# Patient Record
Sex: Male | Born: 1950
Health system: Southern US, Community
[De-identification: ages and names within clinical notes are randomized; demographics above are authoritative.]

## PROBLEM LIST (undated history)

## (undated) DIAGNOSIS — K219 Gastro-esophageal reflux disease without esophagitis: Secondary | ICD-10-CM

## (undated) DIAGNOSIS — N529 Male erectile dysfunction, unspecified: Secondary | ICD-10-CM

## (undated) DIAGNOSIS — Z972 Presence of dental prosthetic device (complete) (partial): Secondary | ICD-10-CM

## (undated) DIAGNOSIS — E785 Hyperlipidemia, unspecified: Secondary | ICD-10-CM

## (undated) DIAGNOSIS — D649 Anemia, unspecified: Secondary | ICD-10-CM

## (undated) DIAGNOSIS — Z5189 Encounter for other specified aftercare: Secondary | ICD-10-CM

## (undated) DIAGNOSIS — K5790 Diverticulosis of intestine, part unspecified, without perforation or abscess without bleeding: Secondary | ICD-10-CM

## (undated) DIAGNOSIS — E162 Hypoglycemia, unspecified: Secondary | ICD-10-CM

## (undated) DIAGNOSIS — Z973 Presence of spectacles and contact lenses: Secondary | ICD-10-CM

## (undated) DIAGNOSIS — K635 Polyp of colon: Secondary | ICD-10-CM

## (undated) HISTORY — PX: POLYPECTOMY: SHX149

## (undated) HISTORY — PX: COLONOSCOPY: SHX174

## (undated) HISTORY — PX: OTHER SURGICAL HISTORY: SHX169

## (undated) HISTORY — DX: Encounter for other specified aftercare: Z51.89

## (undated) HISTORY — PX: CHOLECYSTECTOMY: SHX55

## (undated) HISTORY — DX: Hyperlipidemia, unspecified: E78.5

## (undated) HISTORY — PX: APPENDECTOMY: SHX54

## (undated) HISTORY — DX: Gastro-esophageal reflux disease without esophagitis: K21.9

## (undated) HISTORY — DX: Polyp of colon: K63.5

## (undated) HISTORY — DX: Diverticulosis of intestine, part unspecified, without perforation or abscess without bleeding: K57.90

---

## 2003-09-23 ENCOUNTER — Emergency Department (HOSPITAL_COMMUNITY): Admission: EM | Admit: 2003-09-23 | Discharge: 2003-09-23 | Payer: Self-pay | Admitting: Emergency Medicine

## 2004-06-19 ENCOUNTER — Ambulatory Visit: Payer: Self-pay | Admitting: Family Medicine

## 2004-06-24 ENCOUNTER — Ambulatory Visit: Payer: Self-pay | Admitting: Family Medicine

## 2005-01-06 ENCOUNTER — Ambulatory Visit: Payer: Self-pay | Admitting: Family Medicine

## 2005-02-19 ENCOUNTER — Ambulatory Visit: Payer: Self-pay | Admitting: Family Medicine

## 2005-02-21 ENCOUNTER — Ambulatory Visit: Payer: Self-pay | Admitting: Family Medicine

## 2005-05-07 ENCOUNTER — Ambulatory Visit: Payer: Self-pay | Admitting: Family Medicine

## 2008-10-13 ENCOUNTER — Ambulatory Visit: Payer: Self-pay | Admitting: Gastroenterology

## 2008-10-27 ENCOUNTER — Ambulatory Visit: Payer: Self-pay | Admitting: Gastroenterology

## 2008-11-10 ENCOUNTER — Ambulatory Visit: Payer: Self-pay | Admitting: Gastroenterology

## 2008-11-24 ENCOUNTER — Ambulatory Visit: Payer: Self-pay | Admitting: Gastroenterology

## 2008-11-24 ENCOUNTER — Encounter: Payer: Self-pay | Admitting: Gastroenterology

## 2008-11-24 DIAGNOSIS — K219 Gastro-esophageal reflux disease without esophagitis: Secondary | ICD-10-CM

## 2008-11-24 LAB — CONVERTED CEMR LAB: UREASE: NEGATIVE

## 2008-11-28 ENCOUNTER — Encounter: Payer: Self-pay | Admitting: Gastroenterology

## 2011-02-11 DIAGNOSIS — H3020 Posterior cyclitis, unspecified eye: Secondary | ICD-10-CM | POA: Insufficient documentation

## 2011-08-12 DIAGNOSIS — B0051 Herpesviral iridocyclitis: Secondary | ICD-10-CM | POA: Insufficient documentation

## 2014-07-11 DIAGNOSIS — B0051 Herpesviral iridocyclitis: Secondary | ICD-10-CM | POA: Diagnosis not present

## 2014-07-11 DIAGNOSIS — H2011 Chronic iridocyclitis, right eye: Secondary | ICD-10-CM | POA: Diagnosis not present

## 2014-07-11 DIAGNOSIS — B0052 Herpesviral keratitis: Secondary | ICD-10-CM | POA: Insufficient documentation

## 2014-07-19 DIAGNOSIS — B0052 Herpesviral keratitis: Secondary | ICD-10-CM | POA: Diagnosis not present

## 2014-07-26 DIAGNOSIS — B0051 Herpesviral iridocyclitis: Secondary | ICD-10-CM | POA: Diagnosis not present

## 2014-08-07 DIAGNOSIS — B0051 Herpesviral iridocyclitis: Secondary | ICD-10-CM | POA: Diagnosis not present

## 2014-09-04 DIAGNOSIS — M549 Dorsalgia, unspecified: Secondary | ICD-10-CM | POA: Diagnosis not present

## 2014-09-20 DIAGNOSIS — B0051 Herpesviral iridocyclitis: Secondary | ICD-10-CM | POA: Diagnosis not present

## 2014-10-02 ENCOUNTER — Encounter: Payer: Self-pay | Admitting: Gastroenterology

## 2014-10-02 ENCOUNTER — Other Ambulatory Visit: Payer: Self-pay | Admitting: Dermatology

## 2014-10-02 DIAGNOSIS — L309 Dermatitis, unspecified: Secondary | ICD-10-CM | POA: Diagnosis not present

## 2014-10-02 DIAGNOSIS — L308 Other specified dermatitis: Secondary | ICD-10-CM | POA: Diagnosis not present

## 2014-11-08 DIAGNOSIS — E782 Mixed hyperlipidemia: Secondary | ICD-10-CM | POA: Diagnosis not present

## 2014-11-08 DIAGNOSIS — Z0000 Encounter for general adult medical examination without abnormal findings: Secondary | ICD-10-CM | POA: Diagnosis not present

## 2014-11-14 DIAGNOSIS — E782 Mixed hyperlipidemia: Secondary | ICD-10-CM | POA: Diagnosis not present

## 2014-11-14 DIAGNOSIS — R5383 Other fatigue: Secondary | ICD-10-CM | POA: Diagnosis not present

## 2014-11-14 DIAGNOSIS — L570 Actinic keratosis: Secondary | ICD-10-CM | POA: Diagnosis not present

## 2014-11-14 DIAGNOSIS — M1990 Unspecified osteoarthritis, unspecified site: Secondary | ICD-10-CM | POA: Diagnosis not present

## 2015-02-10 DIAGNOSIS — Z23 Encounter for immunization: Secondary | ICD-10-CM | POA: Diagnosis not present

## 2015-03-15 DIAGNOSIS — J019 Acute sinusitis, unspecified: Secondary | ICD-10-CM | POA: Diagnosis not present

## 2015-03-21 DIAGNOSIS — B0052 Herpesviral keratitis: Secondary | ICD-10-CM | POA: Diagnosis not present

## 2015-03-27 DIAGNOSIS — J069 Acute upper respiratory infection, unspecified: Secondary | ICD-10-CM | POA: Diagnosis not present

## 2015-03-27 DIAGNOSIS — J22 Unspecified acute lower respiratory infection: Secondary | ICD-10-CM | POA: Diagnosis not present

## 2015-03-27 DIAGNOSIS — R05 Cough: Secondary | ICD-10-CM | POA: Diagnosis not present

## 2015-05-04 DIAGNOSIS — R55 Syncope and collapse: Secondary | ICD-10-CM | POA: Diagnosis not present

## 2015-06-05 DIAGNOSIS — E782 Mixed hyperlipidemia: Secondary | ICD-10-CM | POA: Diagnosis not present

## 2015-06-05 DIAGNOSIS — R5383 Other fatigue: Secondary | ICD-10-CM | POA: Diagnosis not present

## 2015-06-05 DIAGNOSIS — Z0000 Encounter for general adult medical examination without abnormal findings: Secondary | ICD-10-CM | POA: Diagnosis not present

## 2015-06-05 DIAGNOSIS — M1990 Unspecified osteoarthritis, unspecified site: Secondary | ICD-10-CM | POA: Diagnosis not present

## 2015-06-12 DIAGNOSIS — Z23 Encounter for immunization: Secondary | ICD-10-CM | POA: Diagnosis not present

## 2015-06-12 DIAGNOSIS — E782 Mixed hyperlipidemia: Secondary | ICD-10-CM | POA: Diagnosis not present

## 2015-06-12 DIAGNOSIS — M79645 Pain in left finger(s): Secondary | ICD-10-CM | POA: Diagnosis not present

## 2015-06-20 DIAGNOSIS — B0052 Herpesviral keratitis: Secondary | ICD-10-CM | POA: Diagnosis not present

## 2015-08-10 DIAGNOSIS — F0390 Unspecified dementia without behavioral disturbance: Secondary | ICD-10-CM | POA: Diagnosis not present

## 2015-09-06 DIAGNOSIS — F0390 Unspecified dementia without behavioral disturbance: Secondary | ICD-10-CM | POA: Diagnosis not present

## 2015-09-06 DIAGNOSIS — E782 Mixed hyperlipidemia: Secondary | ICD-10-CM | POA: Diagnosis not present

## 2015-12-26 DIAGNOSIS — H2513 Age-related nuclear cataract, bilateral: Secondary | ICD-10-CM | POA: Diagnosis not present

## 2015-12-26 DIAGNOSIS — B0052 Herpesviral keratitis: Secondary | ICD-10-CM | POA: Diagnosis not present

## 2016-03-21 DIAGNOSIS — F0390 Unspecified dementia without behavioral disturbance: Secondary | ICD-10-CM | POA: Diagnosis not present

## 2016-03-21 DIAGNOSIS — E782 Mixed hyperlipidemia: Secondary | ICD-10-CM | POA: Diagnosis not present

## 2016-03-27 DIAGNOSIS — F0390 Unspecified dementia without behavioral disturbance: Secondary | ICD-10-CM | POA: Diagnosis not present

## 2016-03-27 DIAGNOSIS — E782 Mixed hyperlipidemia: Secondary | ICD-10-CM | POA: Diagnosis not present

## 2016-08-31 ENCOUNTER — Emergency Department (HOSPITAL_COMMUNITY): Payer: Medicare Other

## 2016-08-31 ENCOUNTER — Encounter (HOSPITAL_COMMUNITY): Payer: Self-pay | Admitting: Emergency Medicine

## 2016-08-31 ENCOUNTER — Emergency Department (HOSPITAL_COMMUNITY)
Admission: EM | Admit: 2016-08-31 | Discharge: 2016-08-31 | Disposition: A | Discharge status: Home / Self Care | Payer: Medicare Other | Attending: Emergency Medicine | Admitting: Emergency Medicine

## 2016-08-31 DIAGNOSIS — M791 Myalgia, unspecified site: Secondary | ICD-10-CM

## 2016-08-31 DIAGNOSIS — R9431 Abnormal electrocardiogram [ECG] [EKG]: Secondary | ICD-10-CM | POA: Diagnosis not present

## 2016-08-31 DIAGNOSIS — R531 Weakness: Secondary | ICD-10-CM | POA: Diagnosis not present

## 2016-08-31 DIAGNOSIS — R509 Fever, unspecified: Secondary | ICD-10-CM

## 2016-08-31 HISTORY — DX: Hypoglycemia, unspecified: E16.2

## 2016-08-31 LAB — URINALYSIS, ROUTINE W REFLEX MICROSCOPIC
BACTERIA UA: NONE SEEN
Bilirubin Urine: NEGATIVE
Glucose, UA: NEGATIVE mg/dL
Ketones, ur: NEGATIVE mg/dL
Leukocytes, UA: NEGATIVE
NITRITE: NEGATIVE
Protein, ur: NEGATIVE mg/dL
SPECIFIC GRAVITY, URINE: 1.027 (ref 1.005–1.030)
pH: 5 (ref 5.0–8.0)

## 2016-08-31 LAB — HEPATIC FUNCTION PANEL
ALK PHOS: 90 U/L (ref 38–126)
ALT: 27 U/L (ref 17–63)
AST: 36 U/L (ref 15–41)
Albumin: 3.9 g/dL (ref 3.5–5.0)
BILIRUBIN DIRECT: 0.2 mg/dL (ref 0.1–0.5)
BILIRUBIN TOTAL: 0.7 mg/dL (ref 0.3–1.2)
Indirect Bilirubin: 0.5 mg/dL (ref 0.3–0.9)
Total Protein: 7.3 g/dL (ref 6.5–8.1)

## 2016-08-31 LAB — BASIC METABOLIC PANEL
Anion gap: 5 (ref 5–15)
BUN: 16 mg/dL (ref 6–20)
CHLORIDE: 103 mmol/L (ref 101–111)
CO2: 24 mmol/L (ref 22–32)
CREATININE: 1.26 mg/dL — AB (ref 0.61–1.24)
Calcium: 9.2 mg/dL (ref 8.9–10.3)
GFR calc Af Amer: >60 mL/min (ref >60)
GFR calc non Af Amer: 57 mL/min — ABNORMAL LOW (ref >60)
Glucose, Bld: 104 mg/dL — ABNORMAL HIGH (ref 65–99)
Potassium: 4.3 mmol/L (ref 3.5–5.1)
SODIUM: 132 mmol/L — AB (ref 135–145)

## 2016-08-31 LAB — CBC WITH DIFFERENTIAL/PLATELET
Basophils Absolute: 0.1 10*3/uL (ref 0.0–0.1)
Basophils Relative: 1 %
EOS ABS: 0.1 10*3/uL (ref 0.0–0.7)
EOS PCT: 2 %
HCT: 44.4 % (ref 39.0–52.0)
HEMOGLOBIN: 14.9 g/dL (ref 13.0–17.0)
LYMPHS ABS: 1.8 10*3/uL (ref 0.7–4.0)
Lymphocytes Relative: 32 %
MCH: 29.7 pg (ref 26.0–34.0)
MCHC: 33.6 g/dL (ref 30.0–36.0)
MCV: 88.4 fL (ref 78.0–100.0)
MONO ABS: 0.7 10*3/uL (ref 0.1–1.0)
MONOS PCT: 12 %
Neutro Abs: 3 10*3/uL (ref 1.7–7.7)
Neutrophils Relative %: 53 %
PLATELETS: 154 10*3/uL (ref 150–400)
RBC: 5.02 MIL/uL (ref 4.22–5.81)
RDW: 14.6 % (ref 11.5–15.5)
WBC: 5.7 10*3/uL (ref 4.0–10.5)

## 2016-08-31 LAB — CK: CK TOTAL: 60 U/L (ref 49–397)

## 2016-08-31 LAB — TROPONIN I

## 2016-08-31 MED ORDER — ACETAMINOPHEN 325 MG PO TABS
650.0000 mg | ORAL_TABLET | Freq: Four times a day (QID) | ORAL | Status: DC | PRN
  Administered 2016-08-31: 650 mg via ORAL
  Filled 2016-08-31: qty 2

## 2016-08-31 MED ORDER — DOXYCYCLINE HYCLATE 100 MG PO CAPS: 100.0000 mg | ORAL_CAPSULE | Freq: Two times a day (BID) | ORAL | 0 refills | Status: DC

## 2016-08-31 NOTE — ED Notes (Signed)
Pt taken off of [place]; will trial off of [place] before discharging.

## 2016-08-31 NOTE — ED Provider Notes (Signed)
Medical screening examination/treatment/procedure(s) were conducted as a shared visit with non-physician practitioner(s) and myself.  I personally evaluated the patient during the encounter.   EKG Interpretation None     Patient had several weeks of general fatigue. He also reports generalized muscle aches. He has not had documented fever. He reports loss of appetite however has eaten a sausage biscuit today and yesterday morning as well as a 6 inch sub-yesterday. He is not having vomiting or diarrhea. On examination the patient is alert and clinically well and appearance. Heart rate is regular. Lungs are diminished breath sounds at the bases with occasional expiratory wheeze. No respiratory distress. Abdomen soft nontender. No peripheral edema calves are soft nontender skin warm and dry. Neurologic normal. I agree with diagnostic plan and workup.   Charlesetta Shanks, MD 08/31/16 [phone]

## 2016-08-31 NOTE — ED Notes (Signed)
Pt sats trending between 88-92%. Pt placed on 2 L [place].

## 2016-08-31 NOTE — ED Notes (Signed)
ED Provider at bedside. 

## 2016-08-31 NOTE — ED Triage Notes (Addendum)
All over body aches x 2-3weeks , denies n/v/d, but states he has the chills off and oin . Not eating , he states he has never felt this bad ,  Wife states she was worried about tick disease

## 2016-08-31 NOTE — ED Provider Notes (Signed)
[facility] DEPT Provider Note   CSN: [id number] Arrival date & time: 08/31/16  1425     History   Chief Complaint Chief Complaint  Patient presents with  . Generalized Body Aches    HPI Jonathan Burnett is a 66 y.o. male.  The history is provided by the patient. No language interpreter was used.  Weakness  This is a new problem. The current episode started more than 1 week ago. The problem has not changed since onset.There was no focality noted. There has been no fever. Associated symptoms include headaches. Pertinent negatives include no shortness of breath, no chest pain and no vomiting. There were no medications administered prior to arrival. Associated medical issues include trauma and seizures. Associated medical issues do not include a bleeding disorder.  Pt complains of feeling weak for 3 weeks.  Pt reports decreased appetite.  Pt reports he is eating but not as much as usual.  Pt is concerned that lipitor was causing his symptoms.  Pt also concerned about tick born illness.  No uti symptoms, no vomiting, no coughing, no weight loss  Past Medical History:  Diagnosis Date  . Hypoglycemia     Patient Active Problem List   Diagnosis Date Noted  . GASTRITIS 11/24/2008    History reviewed. No pertinent surgical history.     Home Medications    Prior to Admission medications   Medication Sig Start Date End Date Taking? Authorizing Provider  atorvastatin (LIPITOR) 20 MG tablet Take 20 mg by mouth daily.   Yes [provider]    Family History No family history on file.  Social History Social History  Substance Use Topics  . Smoking status: Never Smoker  . Smokeless tobacco: Never Used  . Alcohol use No     Allergies   Aspirin   Review of Systems Review of Systems  Respiratory: Negative for shortness of breath.   Cardiovascular: Negative for chest pain.  Gastrointestinal: Negative for vomiting.  Neurological: Positive for weakness and  headaches.  All other systems reviewed and are negative.    Physical Exam Updated Vital Signs BP 115/82 (BP Location: Right Arm)   Pulse 81   Temp (!) 101.5 F (38.6 C) (Oral)   Resp 20   SpO2 (!) 88%   Physical Exam  Constitutional: He appears well-developed and well-nourished.  HENT:  Head: Normocephalic.  Right Ear: External ear normal.  Left Ear: External ear normal.  Nose: Nose normal.  Mouth/Throat: Oropharynx is clear and moist.  Eyes: EOM are normal. Pupils are equal, round, and reactive to light.  Neck: Normal range of motion.  Cardiovascular: Normal rate.   Pulmonary/Chest: Effort normal.  Abdominal: Soft. There is no tenderness.  Musculoskeletal: Normal range of motion.  Neurological: He is alert.  Skin: Skin is warm.  Psychiatric: He has a normal mood and affect.  Nursing note and vitals reviewed.    ED Treatments / Results  Labs (all labs ordered are listed, but only abnormal results are displayed) Labs Reviewed  BASIC METABOLIC PANEL - Abnormal; Notable for the following:       Result Value   Sodium 132 (*)    Glucose, Bld 104 (*)    Creatinine, Ser 1.26 (*)    GFR calc non Af Amer 57 (*)    All other components within normal limits  URINALYSIS, ROUTINE W REFLEX MICROSCOPIC - Abnormal; Notable for the following:    Hgb urine dipstick SMALL (*)    Squamous Epithelial / LPF 0-5 (*)  All other components within normal limits  CBC WITH DIFFERENTIAL/PLATELET  HEPATIC FUNCTION PANEL  CK  TROPONIN I  B. BURGDORFI ANTIBODIES  ROCKY MTN SPOTTED FVR ABS PNL(IGG+IGM)    EKG  EKG Interpretation None       Radiology Dg Chest 2 View  Result Date: 08/31/2016 CLINICAL DATA:  Body aches all over.  Weakness. EXAM: CHEST  2 VIEW COMPARISON:  03/27/2015 FINDINGS: Heart and mediastinal contours are within normal limits. No focal opacities or effusions. No acute bony abnormality. IMPRESSION: No active cardiopulmonary disease. Electronically Signed   By:  Rolm Baptise M.D.   On: 08/31/2016 15:05    Procedures Procedures (including critical care time)  Medications Ordered in ED Medications  acetaminophen (TYLENOL) tablet 650 mg (650 mg Oral Given 08/31/16 1810)     Initial Impression / Assessment and Plan / ED Course  I have reviewed the triage vital signs and the nursing notes.  Pertinent labs & imaging results that were available during my care of the patient were reviewed by me and considered in my medical decision making (see chart for details).       Final Clinical Impressions(s) / ED Diagnoses   Final diagnoses:  Fever, unspecified fever cause  Myalgia    New Prescriptions New Prescriptions   DOXYCYCLINE (VIBRAMYCIN) 100 MG CAPSULE    Take 1 capsule (100 mg total) by mouth 2 (two) times daily.   An After Visit Summary was printed and given to the patient. Pt advised to see his Md for 2 day recheck.  Tylenol every 4 hours for fever   Sidney Ace 08/31/16 1916    Charlesetta Shanks, MD 09/04/16 [phone]

## 2016-09-02 LAB — B. BURGDORFI ANTIBODIES

## 2016-09-02 LAB — ROCKY MTN SPOTTED FVR ABS PNL(IGG+IGM)
RMSF IGM: 0.62 {index} (ref 0.00–0.89)
RMSF IgG: POSITIVE — AB

## 2016-09-02 LAB — RMSF, IGG, IFA

## 2016-09-03 ENCOUNTER — Encounter: Payer: Self-pay | Admitting: Family Medicine

## 2016-09-03 ENCOUNTER — Ambulatory Visit (INDEPENDENT_AMBULATORY_CARE_PROVIDER_SITE_OTHER): Payer: Medicare Other | Admitting: Family Medicine

## 2016-09-03 VITALS — BP 102/70 | HR 74 | Temp 98.1°F | Ht 72.0 in | Wt 155.2 lb

## 2016-09-03 DIAGNOSIS — E785 Hyperlipidemia, unspecified: Secondary | ICD-10-CM

## 2016-09-03 DIAGNOSIS — A770 Spotted fever due to Rickettsia rickettsii: Secondary | ICD-10-CM | POA: Diagnosis not present

## 2016-09-03 NOTE — Patient Instructions (Signed)
Great to meet you!  Lets follow up in 4-6 weeks, we will check some labs then.   See the handout I gave you about RMSF.

## 2016-09-03 NOTE — Progress Notes (Signed)
   HPI  Patient presents today here to establish care with emergency room follow-up.  Patient was seen in the emergency room on 5/20 with approximately one month of fever, malaise, joint aches. He was treated with doxycycline after workup and later found to have positive RMSF antibodies. He's had much improvement over the last few days taking doxycycline. He is tolerating the medication easily.  He states that his malaise is improving, joint pains are resolving.  He was taking Lipitor intermittently, he began to take it daily just before this illness started and he felt that it may be due to the Lipitor so he stopped this.  He is not a smoker, he is a former smoker.  He is tolerating food and fluids like usual. He denies chest pain, dyspnea, palpitations, leg edema.   PMH: Hyperlipidemia, hypoglycemia Surgical history: Appendectomy, cholecystectomy Family history of stomach cancer in brother, hypertension and father, unknown cancer in father Social history: No alcohol or tobacco abuse. ROS: Per HPI  Objective: BP 102/70   Pulse 74   Temp 98.1 F (36.7 C) (Oral)   Ht 6' (1.829 m)   Wt 155 lb 3.2 oz (70.4 kg)   BMI 21.05 kg/m  Gen: NAD, alert, cooperative with exam HEENT: NCAT, EOMI, PERRL, oropharynx clear, TMs normal bilaterally, nares clear CV: RRR, good S1/S2, no murmur Resp: CTABL, no wheezes, non-labored Abd: SNTND, BS present, no guarding or organomegaly Ext: No edema, warm Neuro: Alert and oriented, drink 5/5 and sensation intact in bilateral lower extremities, 2+ patellar tendon reflexes bilaterally  Assessment and plan:  # [place] spotted fever Improving with doxycycline, recommended continuing for 3 weeks. Low threshold for follow-up if symptoms worsen for anything changes  # Hyperlipidemia Continue holding Lipitor per patient's preference Plan to restart in 4-6 weeks on follow up We'll plan to repeat labs at that time.   Laroy Apple, MD [facility] 09/03/2016, 1:53 PM

## 2016-09-12 ENCOUNTER — Encounter: Payer: Self-pay | Admitting: Family Medicine

## 2016-09-12 ENCOUNTER — Ambulatory Visit (INDEPENDENT_AMBULATORY_CARE_PROVIDER_SITE_OTHER): Payer: Medicare Other | Admitting: Family Medicine

## 2016-09-12 VITALS — BP 105/76 | HR 85 | Temp 99.1°F | Ht 72.0 in | Wt 151.0 lb

## 2016-09-12 DIAGNOSIS — A770 Spotted fever due to Rickettsia rickettsii: Secondary | ICD-10-CM | POA: Diagnosis not present

## 2016-09-12 LAB — URINALYSIS, COMPLETE
Bilirubin, UA: NEGATIVE
GLUCOSE, UA: NEGATIVE
LEUKOCYTES UA: NEGATIVE
Nitrite, UA: NEGATIVE
Urobilinogen, Ur: 0.2 mg/dL (ref 0.2–1.0)
pH, UA: 5.5 (ref 5.0–7.5)

## 2016-09-12 LAB — MICROSCOPIC EXAMINATION
BACTERIA UA: NONE SEEN
Renal Epithel, UA: NONE SEEN /hpf
WBC, UA: NONE SEEN /hpf (ref 0–?)

## 2016-09-12 MED ORDER — DOXYCYCLINE HYCLATE 100 MG PO CAPS: 100.0000 mg | ORAL_CAPSULE | Freq: Two times a day (BID) | ORAL | 0 refills | Status: DC

## 2016-09-12 NOTE — Progress Notes (Signed)
BP 105/76   Pulse 85   Temp 99.1 F (37.3 C) (Oral)   Ht 6' (1.829 m)   Wt 151 lb (68.5 kg)   BMI 20.48 kg/m    Subjective:    Patient ID: Jonathan Burnett, male    DOB: September 23, 1950, 66 y.o.   MRN: [id number]  HPI: Jonathan Burnett is a 66 y.o. male presenting on 09/12/2016 for Headache, fever, body aches, decreased appetite (has been on Doxycycline for RMSF, thought he was doing better but Wednesday started feeling like this again) and White coating on tongue   HPI [facility] spotted fever follow-up Patient was feeling better initially on the doxycycline but over the past couple days he started to feel worse. His appetite has decreased he is starting to feel achy and feverish. His temperature today in office is 99.1. He has been on the doxycycline about 2 weeks now. He does not feel like eating or drinking anything and his wife is really concerned about him over the past couple days. She says that he has been trying to get up and do things that he normally does and just does not have any energy and he feels like he has a dry tongue with white coating over it associated with this decreased energy.  Relevant past medical, surgical, family and social history reviewed and updated as indicated. Interim medical history since our last visit reviewed. Allergies and medications reviewed and updated.  Review of Systems  Constitutional: Positive for fatigue and fever. Negative for chills.  HENT: Negative for congestion.   Eyes: Negative for discharge.  Respiratory: Negative for cough, shortness of breath and wheezing.   Cardiovascular: Negative for chest pain and leg swelling.  Gastrointestinal: Positive for abdominal distention (Gassiness) and nausea. Negative for abdominal pain, constipation, diarrhea and vomiting.  Genitourinary: Negative for decreased urine volume.  Musculoskeletal: Positive for myalgias. Negative for back pain and gait problem.  Skin: Negative for rash.    Psychiatric/Behavioral: Positive for sleep disturbance.  All other systems reviewed and are negative.   Per HPI unless specifically indicated above   Allergies as of 09/12/2016      Reactions   Aspirin    REACTION: GI upset/nausea      Medication List       Accurate as of 09/12/16  3:52 PM. Always use your most recent med list.          doxycycline 100 MG capsule Commonly known as:  VIBRAMYCIN Take 1 capsule (100 mg total) by mouth 2 (two) times daily.          Objective:    BP 105/76   Pulse 85   Temp 99.1 F (37.3 C) (Oral)   Ht 6' (1.829 m)   Wt 151 lb (68.5 kg)   BMI 20.48 kg/m   Wt Readings from Last 3 Encounters:  09/12/16 151 lb (68.5 kg)  09/03/16 155 lb 3.2 oz (70.4 kg)    Physical Exam  Constitutional: He is oriented to person, place, and time. He appears well-developed and well-nourished. No distress.  HENT:  Right Ear: External ear normal.  Left Ear: External ear normal.  Nose: Nose normal.  Mouth/Throat: Oropharynx is clear and moist.  Eyes: Conjunctivae are normal. No scleral icterus.  Neck: Neck supple.  Cardiovascular: Normal rate, regular rhythm, normal heart sounds and intact distal pulses.   No murmur heard. Pulmonary/Chest: Effort normal and breath sounds normal. No respiratory distress. He has no wheezes. He has no rales.  Musculoskeletal: Normal  range of motion. He exhibits no edema.  Lymphadenopathy:    He has no cervical adenopathy.  Neurological: He is alert and oriented to person, place, and time. Coordination normal.  Skin: Skin is warm and dry. No rash noted. He is not diaphoretic.  Psychiatric: He has a normal mood and affect. His behavior is normal.  Nursing note and vitals reviewed.  Urinalysis: 0-5 wbc's, 0-5 rbc's, trace ketones, 1+ bili, otherwise negative     Assessment & Plan:   Problem List Items Addressed This Visit    None    Visit Diagnoses    RMSF [facility] spotted fever)    -  Primary   Was feeling  better but then started feeling worse again over the past few days, we'll do a bunch of lab work and give him a longer course of Doxy   Relevant Medications   doxycycline (VIBRAMYCIN) 100 MG capsule   Other Relevant Orders   CBC with Differential/Platelet   CMP14+EGFR   Urinalysis, Complete       Follow up plan: Return in about 2 weeks (around 09/26/2016), or if symptoms worsen or fail to improve, for Follow-up [facility] spotted fever.  Counseling provided for all of the vaccine components Orders Placed This Encounter  Procedures  . CBC with Differential/Platelet  . CMP14+EGFR  . Urinalysis, Complete    Caryl Pina, MD [facility] 09/12/2016, 3:52 PM

## 2016-09-13 LAB — CMP14+EGFR
A/G RATIO: 1.4 (ref 1.2–2.2)
ALBUMIN: 3.9 g/dL (ref 3.6–4.8)
ALT: 54 IU/L — AB (ref 0–44)
AST: 26 IU/L (ref 0–40)
Alkaline Phosphatase: 159 IU/L — ABNORMAL HIGH (ref 39–117)
BUN / CREAT RATIO: 21 (ref 10–24)
BUN: 19 mg/dL (ref 8–27)
Bilirubin Total: 0.3 mg/dL (ref 0.0–1.2)
CALCIUM: 9.2 mg/dL (ref 8.6–10.2)
CHLORIDE: 100 mmol/L (ref 96–106)
CO2: 22 mmol/L (ref 18–29)
Creatinine, Ser: 0.92 mg/dL (ref 0.76–1.27)
GFR, EST AFRICAN AMERICAN: 99 mL/min/{1.73_m2} (ref >59)
GFR, EST NON AFRICAN AMERICAN: 86 mL/min/{1.73_m2} (ref >59)
Globulin, Total: 2.8 g/dL (ref 1.5–4.5)
Glucose: 86 mg/dL (ref 65–99)
POTASSIUM: 4.2 mmol/L (ref 3.5–5.2)
Sodium: 136 mmol/L (ref 134–144)
Total Protein: 6.7 g/dL (ref 6.0–8.5)

## 2016-09-13 LAB — CBC WITH DIFFERENTIAL/PLATELET
Basophils Absolute: 0.1 10*3/uL (ref 0.0–0.2)
Basos: 1 %
EOS (ABSOLUTE): 0.2 10*3/uL (ref 0.0–0.4)
EOS: 3 %
HEMATOCRIT: 43.5 % (ref 37.5–51.0)
HEMOGLOBIN: 14.4 g/dL (ref 13.0–17.7)
IMMATURE GRANS (ABS): 0 10*3/uL (ref 0.0–0.1)
IMMATURE GRANULOCYTES: 0 %
LYMPHS: 39 %
Lymphocytes Absolute: 2.3 10*3/uL (ref 0.7–3.1)
MCH: 29.3 pg (ref 26.6–33.0)
MCHC: 33.1 g/dL (ref 31.5–35.7)
MCV: 88 fL (ref 79–97)
MONOCYTES: 9 %
Monocytes Absolute: 0.5 10*3/uL (ref 0.1–0.9)
NEUTROS ABS: 2.8 10*3/uL (ref 1.4–7.0)
NEUTROS PCT: 48 %
PLATELETS: 256 10*3/uL (ref 150–379)
RBC: 4.92 x10E6/uL (ref 4.14–5.80)
RDW: 15.4 % (ref 12.3–15.4)
WBC: 5.9 10*3/uL (ref 3.4–10.8)

## 2016-09-19 ENCOUNTER — Telehealth: Payer: Self-pay | Admitting: *Deleted

## 2016-09-19 NOTE — Telephone Encounter (Signed)
Pt notified of results Verbalizes understanding 

## 2016-09-19 NOTE — Telephone Encounter (Signed)
-----   Message from Worthy Rancher, MD sent at 09/13/2016  7:35 AM EDT ----- All labs are normal except a mildly elevated liver function, this could be from the dehydration, this is something we will just recheck in the future. Continue doxycycline and continue to monitor

## 2016-10-20 ENCOUNTER — Encounter: Payer: Self-pay | Admitting: Family Medicine

## 2016-10-20 ENCOUNTER — Ambulatory Visit (INDEPENDENT_AMBULATORY_CARE_PROVIDER_SITE_OTHER): Payer: Medicare Other | Admitting: Family Medicine

## 2016-10-20 VITALS — BP 128/84 | HR 76 | Temp 98.3°F | Ht 73.0 in | Wt 157.4 lb

## 2016-10-20 DIAGNOSIS — E785 Hyperlipidemia, unspecified: Secondary | ICD-10-CM | POA: Diagnosis not present

## 2016-10-20 DIAGNOSIS — A770 Spotted fever due to Rickettsia rickettsii: Secondary | ICD-10-CM | POA: Diagnosis not present

## 2016-10-20 NOTE — Progress Notes (Signed)
   HPI  Patient presents today here to follow-up for RMSF.  Patient states that after starting doxycycline he began to feel much better. He has finished the course and feels completely normal now. He's eating like normal. He denies any fever or body aches. Denies rash.  Hyperlipidemia Patient states that he was taking Lipitor only intermittently before his illness, he wanted to stop it during the illness and he requests to stop it for a time and recheck in his annual physical around December.   PMH: Smoking status noted ROS: Per HPI  Objective: BP 128/84   Pulse 76   Temp 98.3 F (36.8 C) (Oral)   Ht 6\' 1"  (1.854 m)   Wt 157 lb 6.4 oz (71.4 kg)   BMI 20.77 kg/m  Gen: NAD, alert, cooperative with exam HEENT: NCAT CV: RRR, good S1/S2, no murmur Resp: CTABL, no wheezes, non-labored Ext: No edema, warm Neuro: Alert and oriented, No gross deficits  Assessment and plan:  # [place] fever Resolved, patient feels much better. Treat with doxycycline, labs were positive in the ER.  # Hyperlipidemia Discussed, labs not available here, per patient's request we will hold medication for now and restart if labs warrant at his physical.   Laroy Apple, MD [facility] 10/20/2016, 5:05 PM

## 2017-02-25 ENCOUNTER — Telehealth: Payer: Self-pay | Admitting: Family Medicine

## 2017-02-26 ENCOUNTER — Other Ambulatory Visit: Payer: Self-pay | Admitting: Family Medicine

## 2017-02-26 DIAGNOSIS — Z0000 Encounter for general adult medical examination without abnormal findings: Secondary | ICD-10-CM

## 2017-02-26 NOTE — Telephone Encounter (Signed)
Will place labs  Laroy Apple, MD [facility] 02/26/2017, 10:07 AM

## 2017-02-27 ENCOUNTER — Encounter: Payer: Self-pay | Admitting: Family Medicine

## 2017-02-27 ENCOUNTER — Ambulatory Visit (INDEPENDENT_AMBULATORY_CARE_PROVIDER_SITE_OTHER): Payer: Medicare Other | Admitting: Family Medicine

## 2017-02-27 VITALS — BP 124/84 | HR 82 | Temp 97.1°F | Ht 73.0 in | Wt 157.0 lb

## 2017-02-27 DIAGNOSIS — R0981 Nasal congestion: Secondary | ICD-10-CM | POA: Diagnosis not present

## 2017-02-27 MED ORDER — FLUTICASONE PROPIONATE 50 MCG/ACT NA SUSP: 1.0000 | Freq: Two times a day (BID) | NASAL | 6 refills | Status: DC | PRN

## 2017-02-27 NOTE — Progress Notes (Signed)
BP 124/84   Pulse 82   Temp (!) 97.1 F (36.2 C) (Oral)   Ht 6\' 1"  (1.854 m)   Wt 157 lb (71.2 kg)   BMI 20.71 kg/m    Subjective:    Patient ID: Jonathan Burnett, male    DOB: 1950-11-07, 66 y.o.   MRN: [id number]  HPI: Jonathan Burnett is a 66 y.o. male presenting on 02/27/2017 for Sinusitis (runny nose, sinus drainage, sore throat; symptoms began yesterday)   HPI Runny nose and sinus congestion Patient has been having runny nose and sinus congestion and drainage that is been going on for the past day.  He complains of sore throat but denies any cough or congestion or wheezing.  He denies any fevers or chills or shortness of breath.  He has been having some sinus pressure but mostly a lot of drainage.  Relevant past medical, surgical, family and social history reviewed and updated as indicated. Interim medical history since our last visit reviewed. Allergies and medications reviewed and updated.  Review of Systems  Constitutional: Negative for chills and fever.  HENT: Positive for congestion, postnasal drip, rhinorrhea, sinus pressure and sore throat. Negative for ear discharge, ear pain, sneezing and voice change.   Eyes: Negative for pain, discharge, redness and visual disturbance.  Respiratory: Negative for cough, shortness of breath and wheezing.   Cardiovascular: Negative for chest pain and leg swelling.  Musculoskeletal: Negative for gait problem.  Skin: Negative for rash.  All other systems reviewed and are negative.   Per HPI unless specifically indicated above     Objective:    BP 124/84   Pulse 82   Temp (!) 97.1 F (36.2 C) (Oral)   Ht 6\' 1"  (1.854 m)   Wt 157 lb (71.2 kg)   BMI 20.71 kg/m   Wt Readings from Last 3 Encounters:  02/27/17 157 lb (71.2 kg)  10/20/16 157 lb 6.4 oz (71.4 kg)  09/12/16 151 lb (68.5 kg)    Physical Exam  Constitutional: He is oriented to person, place, and time. He appears well-developed and well-nourished. No distress.  HENT:    Right Ear: Tympanic membrane, external ear and ear canal normal.  Left Ear: Tympanic membrane, external ear and ear canal normal.  Nose: Mucosal edema and rhinorrhea present. No sinus tenderness. No epistaxis. Right sinus exhibits maxillary sinus tenderness. Right sinus exhibits no frontal sinus tenderness. Left sinus exhibits maxillary sinus tenderness. Left sinus exhibits no frontal sinus tenderness.  Mouth/Throat: Uvula is midline and mucous membranes are normal. Posterior oropharyngeal edema and posterior oropharyngeal erythema present. No oropharyngeal exudate or tonsillar abscesses.  Eyes: Conjunctivae and EOM are normal. Pupils are equal, round, and reactive to light. No scleral icterus.  Neck: Neck supple. No thyromegaly present.  Cardiovascular: Normal rate, regular rhythm, normal heart sounds and intact distal pulses.  No murmur heard. Pulmonary/Chest: Effort normal and breath sounds normal. No respiratory distress. He has no wheezes. He has no rales.  Musculoskeletal: Normal range of motion. He exhibits no edema.  Lymphadenopathy:    He has no cervical adenopathy.  Neurological: He is alert and oriented to person, place, and time. Coordination normal.  Skin: Skin is warm and dry. No rash noted. He is not diaphoretic.  Psychiatric: He has a normal mood and affect. His behavior is normal.  Nursing note and vitals reviewed.     Assessment & Plan:   Problem List Items Addressed This Visit    None    Visit Diagnoses  Nasal sinus congestion    -  Primary   Relevant Medications   fluticasone (FLONASE) 50 MCG/ACT nasal spray       Follow up plan: Return if symptoms worsen or fail to improve.  Counseling provided for all of the vaccine components No orders of the defined types were placed in this encounter.   Caryl Pina, MD [facility] 02/27/2017, 3:59 PM

## 2017-02-28 ENCOUNTER — Telehealth: Payer: Self-pay | Admitting: Family Medicine

## 2017-02-28 NOTE — Telephone Encounter (Signed)
What symptoms do you have? Pt seen yesterday for sinus infection was prescribed flonase ,wife says it is not working he was up half the night  How long have you been sick? About three weks  Have you been seen for this problem? Yes yesterday, wants antibiotic  If your provider decides to give you a prescription, which pharmacy would you like for it to be sent to? [facility]   Patient informed that this information will be sent to the clinical staff for review and that they should receive a follow up call.

## 2017-02-28 NOTE — Telephone Encounter (Signed)
Please advise 

## 2017-02-28 NOTE — Telephone Encounter (Signed)
Will have to wait for dr. [facility] Parisian on Monday

## 2017-03-02 MED ORDER — CEFDINIR 300 MG PO CAPS: 300.0000 mg | ORAL_CAPSULE | Freq: Two times a day (BID) | ORAL | 0 refills | Status: DC

## 2017-03-12 NOTE — Telephone Encounter (Signed)
Attempted to contact patient- No return call. This encounter will be closed.

## 2017-03-24 ENCOUNTER — Encounter: Payer: Medicare Other | Admitting: Family Medicine

## 2017-03-30 ENCOUNTER — Other Ambulatory Visit: Payer: Medicare Other

## 2017-03-30 DIAGNOSIS — E785 Hyperlipidemia, unspecified: Secondary | ICD-10-CM

## 2017-03-31 LAB — CMP14+EGFR
ALK PHOS: 92 IU/L (ref 39–117)
ALT: 10 IU/L (ref 0–44)
AST: 16 IU/L (ref 0–40)
Albumin/Globulin Ratio: 1.5 (ref 1.2–2.2)
Albumin: 4.1 g/dL (ref 3.6–4.8)
BUN/Creatinine Ratio: 18 (ref 10–24)
BUN: 17 mg/dL (ref 8–27)
Bilirubin Total: <0.2 mg/dL (ref 0.0–1.2)
CO2: 21 mmol/L (ref 20–29)
CREATININE: 0.93 mg/dL (ref 0.76–1.27)
Calcium: 9.2 mg/dL (ref 8.6–10.2)
Chloride: 109 mmol/L — ABNORMAL HIGH (ref 96–106)
GFR calc Af Amer: 98 mL/min/{1.73_m2} (ref >59)
GFR calc non Af Amer: 85 mL/min/{1.73_m2} (ref >59)
GLUCOSE: 90 mg/dL (ref 65–99)
Globulin, Total: 2.7 g/dL (ref 1.5–4.5)
Potassium: 4 mmol/L (ref 3.5–5.2)
Sodium: 143 mmol/L (ref 134–144)
Total Protein: 6.8 g/dL (ref 6.0–8.5)

## 2017-03-31 LAB — CBC WITH DIFFERENTIAL/PLATELET
BASOS ABS: 0.1 10*3/uL (ref 0.0–0.2)
Basos: 1 %
EOS (ABSOLUTE): 0.5 10*3/uL — ABNORMAL HIGH (ref 0.0–0.4)
Eos: 6 %
HEMOGLOBIN: 14.2 g/dL (ref 13.0–17.7)
Hematocrit: 44.4 % (ref 37.5–51.0)
IMMATURE GRANULOCYTES: 0 %
Immature Grans (Abs): 0 10*3/uL (ref 0.0–0.1)
LYMPHS ABS: 2.7 10*3/uL (ref 0.7–3.1)
Lymphs: 28 %
MCH: 30.2 pg (ref 26.6–33.0)
MCHC: 32 g/dL (ref 31.5–35.7)
MCV: 95 fL (ref 79–97)
MONOCYTES: 6 %
MONOS ABS: 0.6 10*3/uL (ref 0.1–0.9)
Neutrophils Absolute: 5.8 10*3/uL (ref 1.4–7.0)
Neutrophils: 59 %
Platelets: 193 10*3/uL (ref 150–379)
RBC: 4.7 x10E6/uL (ref 4.14–5.80)
RDW: 14.6 % (ref 12.3–15.4)
WBC: 9.7 10*3/uL (ref 3.4–10.8)

## 2017-03-31 LAB — LIPID PANEL
CHOLESTEROL TOTAL: 163 mg/dL (ref 100–199)
Chol/HDL Ratio: 6.8 ratio — ABNORMAL HIGH (ref 0.0–5.0)
HDL: 24 mg/dL — AB (ref >39)
LDL CALC: 62 mg/dL (ref 0–99)
TRIGLYCERIDES: 385 mg/dL — AB (ref 0–149)
VLDL CHOLESTEROL CAL: 77 mg/dL — AB (ref 5–40)

## 2017-03-31 MED ORDER — ICOSAPENT ETHYL 1 G PO CAPS: 1.0000 mg | ORAL_CAPSULE | Freq: Two times a day (BID) | ORAL | 11 refills | Status: DC

## 2017-03-31 NOTE — Progress Notes (Unsigned)
Patient's cholesterol is elevated, please let him know that I have sent him with a Vascepa to help lower his triglycerides Caryl Pina, MD [facility] 03/31/2017, 11:52 AM

## 2017-04-03 ENCOUNTER — Encounter: Payer: Self-pay | Admitting: *Deleted

## 2017-05-13 ENCOUNTER — Encounter: Payer: Self-pay | Admitting: Family Medicine

## 2017-05-13 ENCOUNTER — Ambulatory Visit (INDEPENDENT_AMBULATORY_CARE_PROVIDER_SITE_OTHER): Payer: Medicare Other | Admitting: Family Medicine

## 2017-05-13 ENCOUNTER — Ambulatory Visit (INDEPENDENT_AMBULATORY_CARE_PROVIDER_SITE_OTHER): Payer: Medicare Other

## 2017-05-13 VITALS — BP 104/61 | HR 68 | Temp 97.3°F | Ht 73.0 in | Wt 158.0 lb

## 2017-05-13 DIAGNOSIS — M533 Sacrococcygeal disorders, not elsewhere classified: Secondary | ICD-10-CM | POA: Diagnosis not present

## 2017-05-13 DIAGNOSIS — M545 Low back pain: Secondary | ICD-10-CM | POA: Diagnosis not present

## 2017-05-13 MED ORDER — PREDNISONE 10 MG PO TABS: ORAL_TABLET | ORAL | 0 refills | Status: DC

## 2017-05-13 NOTE — Progress Notes (Signed)
Chief Complaint  Patient presents with  . Hip Pain    pt here today c/o right hip pain that radiates down his leg and pt denies any recent injury    HPI  Patient presents today for chronic pain that has become more acute recently.  Generally the pain is felt at the SI region region.  It has become more intense.  It is radiating laterally and inferior toward the lateral hip and anterior thigh all the way down the leg.  This has developed over the last few days.  There is no known injury.  No precipitating factors.  It is painful to bend at the waist.  Pain is 6-8/10 intermittently.  This improves  PMH: Smoking status noted ROS: Per HPI  Objective: BP 104/61   Pulse 68   Temp (!) 97.3 F (36.3 C) (Oral)   Ht 6\' 1"  (1.854 m)   Wt 158 lb (71.7 kg)   BMI 20.85 kg/m  Gen: NAD, alert, cooperative with exam HEENT: NCAT, EOMI, PERRL CV: RRR, good S1/S2, no murmur Resp: CTABL, no wheezes, non-labored Abd: SNTND, BS present, no guarding or organomegaly Ext: No edema, warm.  Tender at the right L5 to SI region.  Nontender over the leg and thigh with marginally positive straight leg raise negative Patrick sign. Neuro: Alert and oriented, No gross deficits  Assessment and plan:  1. Sacroiliac pain     Meds ordered this encounter  Medications  . predniSONE (DELTASONE) 10 MG tablet    Sig: Take 5 PO x 2 days, then 4 PO x 2 days, then 3 PO x 2 days, then 2 PO x 2 days then 1 PO x 2  days.    Dispense:  30 tablet    Refill:  0    Orders Placed This Encounter  Procedures  . DG Lumbar Spine 2-3 Views    Standing Status:   Future    Number of Occurrences:   1    Standing Expiration Date:   07/13/2018    Order Specific Question:   Reason for Exam (SYMPTOM  OR DIAGNOSIS REQUIRED)    Answer:   back pain    Order Specific Question:   Preferred imaging location?    Answer:   Internal    Follow up as needed.  Claretta Fraise, MD

## 2017-05-21 ENCOUNTER — Encounter: Payer: Self-pay | Admitting: Family Medicine

## 2017-05-21 ENCOUNTER — Ambulatory Visit (INDEPENDENT_AMBULATORY_CARE_PROVIDER_SITE_OTHER): Payer: Medicare Other | Admitting: Family Medicine

## 2017-05-21 VITALS — BP 110/67 | HR 71 | Temp 98.2°F | Ht 73.0 in | Wt 150.6 lb

## 2017-05-21 DIAGNOSIS — R0981 Nasal congestion: Secondary | ICD-10-CM

## 2017-05-21 DIAGNOSIS — Z0000 Encounter for general adult medical examination without abnormal findings: Secondary | ICD-10-CM

## 2017-05-21 DIAGNOSIS — Z23 Encounter for immunization: Secondary | ICD-10-CM

## 2017-05-21 NOTE — Addendum Note (Signed)
Addended by: Nigel Berthold C on: 05/21/2017 10:41 AM   Modules accepted: Orders

## 2017-05-21 NOTE — Patient Instructions (Signed)
Great to see you!   

## 2017-05-21 NOTE — Progress Notes (Signed)
   HPI  Patient presents today here for an annual physical exam.  Patient feels well and has no complaints except for nasal congestion that started about a week ago after blowing her his leaves.  He states that it did improve with prednisone and but still is bothering him. He had some back pain that was treated with prednisone.  He is physically active but has no formal exercise routine, he does not really watch his diet.  Triglycerides are checked in December and he was started on Vascepa, he is tolerating this well.  PMH: Smoking status noted ROS: Per HPI  Objective: BP 110/67   Pulse 71   Temp 98.2 F (36.8 C) (Oral)   Ht 6\' 1"  (1.854 m)   Wt 150 lb 9.6 oz (68.3 kg)   BMI 19.87 kg/m  Gen: NAD, alert, cooperative with exam HEENT: NCAT, oropharynx moist and clear, nares with swollen turbinates and boggy mucosa, TMs normal bilaterally CV: RRR, good S1/S2, no murmur Resp: CTABL, no wheezes, non-labored Abd: SNTND, BS present, no guarding or organomegaly Ext: No edema, warm Neuro: Alert and oriented, No gross deficits  Assessment and plan:  #Annual physical exam Normal exam, slightly low weight Labs have recently been drawn and were reviewed, continue Vascepa  Pneumovax given, counseling provided for all vaccines and vaccine components  #Nasal congestion Recommended nasal saline rinses    Laroy Apple, MD [facility] 05/21/2017, 10:19 AM

## 2017-07-21 ENCOUNTER — Ambulatory Visit (INDEPENDENT_AMBULATORY_CARE_PROVIDER_SITE_OTHER): Payer: Medicare Other | Admitting: Family Medicine

## 2017-07-21 ENCOUNTER — Encounter: Payer: Self-pay | Admitting: Family Medicine

## 2017-07-21 VITALS — BP 126/75 | HR 80 | Temp 97.5°F | Ht 73.0 in | Wt 153.0 lb

## 2017-07-21 DIAGNOSIS — M545 Low back pain, unspecified: Secondary | ICD-10-CM

## 2017-07-21 MED ORDER — PREDNISONE 10 MG PO TABS: ORAL_TABLET | ORAL | 0 refills | Status: DC

## 2017-07-21 NOTE — Progress Notes (Signed)
   HPI  Patient presents today low back pain.  Patient explains that this is similar to his previous flare about 2 months ago.  Patient states that there is no inciting event. He reports bilateral low back pain in the upper lumbar area with no radiation down the legs. No problems walking.  He has worsening pain with extension of the back.  No bowel or bladder dysfunction. Patient states he has had intermittent symptoms like this for quite a while, he has flares at times. He is responded well to prednisone recently  PMH: Smoking status noted ROS: Per HPI  Objective: BP 126/75   Pulse 80   Temp (!) 97.5 F (36.4 C) (Oral)   Ht 6\' 1"  (1.854 m)   Wt 153 lb (69.4 kg)   BMI 20.19 kg/m  Gen: NAD, alert, cooperative with exam HEENT: NCAT CV: RRR, good S1/S2, no murmur Resp: CTABL, no wheezes, non-labored Ext: No edema, warm Neuro: Alert and oriented, No gross deficits MSK No tenderness to palpation of paraspinal muscles bilaterally or midline spine  Assessment and plan:  #Bilateral low back pain No sciatica No red flags, straight leg raise was negative. Repeat steroid course Discussed physical therapy and limiting steroid use. Discussed heat, ice, Tylenol.   Meds ordered this encounter  Medications  . predniSONE (DELTASONE) 10 MG tablet    Sig: Take 5 PO x 2 days, then 4 PO x 2 days, then 3 PO x 2 days, then 2 PO x 2 days then 1 PO x 2  days.    Dispense:  30 tablet    Refill:  0    Laroy Apple, MD [facility] 07/21/2017, 10:37 AM

## 2017-07-28 ENCOUNTER — Ambulatory Visit: Payer: Medicare Other | Admitting: Family

## 2017-07-28 ENCOUNTER — Ambulatory Visit (INDEPENDENT_AMBULATORY_CARE_PROVIDER_SITE_OTHER): Payer: Medicare Other | Admitting: *Deleted

## 2017-07-28 ENCOUNTER — Encounter: Payer: Self-pay | Admitting: *Deleted

## 2017-07-28 VITALS — BP 136/91 | HR 90 | Ht 68.5 in | Wt 152.0 lb

## 2017-07-28 DIAGNOSIS — Z0000 Encounter for general adult medical examination without abnormal findings: Secondary | ICD-10-CM

## 2017-07-28 NOTE — Patient Instructions (Signed)
  Jonathan Burnett , Thank you for taking time to come for your Medicare Wellness Visit. I appreciate your ongoing commitment to your health goals. Please review the following plan we discussed and let me know if I can assist you in the future.   These are the goals we discussed: Goals    . DIET - INCREASE WATER INTAKE    . Exercise 150 min/wk Moderate Activity       This is a list of the screening recommended for you and due dates:  Health Maintenance  Topic Date Due  . Tetanus Vaccine  10/27/2017*  .  Hepatitis C: One time screening is recommended by Center for Disease Control  (CDC) for  adults born from [birth year] through 1966.   07/29/2018*  . Flu Shot  11/12/2017  . Pneumonia vaccines (2 of 2 - PCV13) 05/21/2018  . Colon Cancer Screening  10/28/2018  *Topic was postponed. The date shown is not the original due date.

## 2017-07-28 NOTE — Progress Notes (Addendum)
Subjective:   Jonathan Burnett is a 67 y.o. male who presents for a subsequent Medicare Annual Wellness Visit.  Jonathan Burnett is retired and lives at home with his wife of 14 years. He has 2 adult daughters and his wife has 5 adult children. His 38 year old grandson stays with them often  Review of Systems  Health is about the same as last year.   Musc: Low back pain for a couple of weeks. No known injury. Has been evaluated and scheduled for follow up tomorrow. Prednisone has helped some.   Cardiac Risk Factors include: advanced age (>50men, >19 women)    Objective:    Today's Vitals   07/28/17 1117 07/28/17 1119  BP: (!) 136/91   Pulse: 90   Weight: 152 lb (68.9 kg)   Height: 5' 8.5" (1.74 m)   PainSc:  5    Body mass index is 22.78 kg/m.  Advanced Directives 07/28/2017 08/31/2016  Does Patient Have a Medical Advance Directive? No No  Would patient like information on creating a medical advance directive? No - Patient declined -    Current Medications (verified) Outpatient Encounter Medications as of 07/28/2017  Medication Sig  . fluticasone (FLONASE) 50 MCG/ACT nasal spray Place 1 spray 2 (two) times daily as needed into both nostrils for allergies or rhinitis.  Vanessa Kick Ethyl (VASCEPA) 1 g CAPS Take 1 mg by mouth 2 (two) times daily.  . predniSONE (DELTASONE) 10 MG tablet Take 5 PO x 2 days, then 4 PO x 2 days, then 3 PO x 2 days, then 2 PO x 2 days then 1 PO x 2  days.   No facility-administered encounter medications on file as of 07/28/2017.     Allergies (verified) Aspirin   History: Past Medical History:  Diagnosis Date  . Diverticulosis   . Hyperlipidemia   . Hypoglycemia    Past Surgical History:  Procedure Laterality Date  . APPENDECTOMY    . gallstones     Family History  Problem Relation Age of Onset  . Cancer Father        lung cancer  . Hypertension Father   . Cancer Brother        stomach  . Healthy Sister   . Healthy Daughter   . Healthy  Brother   . Healthy Brother   . Healthy Brother   . Healthy Sister   . Healthy Daughter    Social History   Socioeconomic History  . Marital status: Married    Spouse name: Not on file  . Number of children: 2  . Years of education: 10  . Highest education level: 10th grade  Occupational History  . 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 Financial resource strain: Somewhat hard  . Food insecurity:    Worry: Never true    Inability: Never true  . Transportation needs:    Medical: No    Non-medical: No  Tobacco Use  . Smoking status: Never Smoker  . Smokeless tobacco: Never Used  Substance and Sexual Activity  . Alcohol use: No  . Drug use: No  . Sexual activity: Yes  Lifestyle  . Physical activity:    Days per week: 7 days    Minutes per session: 60 min  . Stress: Not at all  Relationships  . Social connections:    Talks on phone: Never    Gets together: Never  Attends religious service: Never    Active member of club or organization: No    Attends meetings of clubs or organizations: Never    Relationship status: Widowed  Other Topics Concern  . Not on file  Social History Narrative  . Not on file   Tobacco Counseling Not tobacco use  Clinical Intake:  Pre-visit preparation completed: No  Pain : 0-10 Pain Score: 5  Pain Location: Back Pain Orientation: Lower Pain Radiating Towards: right leg Pain Descriptors / Indicators: Aching Pain Onset: 1 to 4 weeks ago Pain Frequency: Constant Pain Relieving Factors: prednisone has helped some Effect of Pain on Daily Activities: significiant  Pain Relieving Factors: prednisone has helped some  Nutritional Status: BMI of 19-24  Normal Diabetes: No  How often do you need to have someone help you when you read instructions, pamphlets, or other written materials from your doctor or pharmacy?: 4 - Often(has help from his wife) What is the last grade level you  completed in school?: 10th  Interpreter Needed?: No  Information entered by :: Chong Sicilian, RN  Activities of Daily Living In your present state of health, do you have any difficulty performing the following activities: 07/28/2017  Hearing? N  Vision? N  Difficulty concentrating or making decisions? N  Walking or climbing stairs? N  Dressing or bathing? N  Doing errands, shopping? N  Preparing Food and eating ? N  Using the Toilet? N  In the past six months, have you accidently leaked urine? N  Do you have problems with loss of bowel control? N  Managing your Medications? N  Managing your Finances? N  Housekeeping or managing your Housekeeping? N  Some recent data might be hidden     Immunizations and Health Maintenance Immunization History  Administered Date(s) Administered  . Influenza, High Dose Seasonal PF 01/06/2017  . Pneumococcal Polysaccharide-23 05/21/2017   There are no preventive care reminders to display for this patient.  Patient Care Team Kenn File, MD- Primary Care provider  ER visit 08/31/16 for fever. Dx with [facility]. No other ER visits, hospitalizations, or surgeries this past year.   Assessment:   This is a routine wellness examination for Jonathan Burnett.  Hearing/Vision screen No deficits noted during visit.  Dietary issues and exercise activities discussed: Current Exercise Habits: Home exercise routine(stays busy around his home and yard but no formal exercise), Type of exercise: Other - see comments, Time (Minutes): 60, Frequency (Times/Week): 5, Weekly Exercise (Minutes/Week): 300, Intensity: Mild, Exercise limited by: None identified  Diet  Eats mostly home cooked meals and eats out infrequently. Eats at least 3 meals a day.  Doesn't drink very much water. Drinks Dr Malachi Bonds mostly. Drinks at least 2 a day.   Goals    . DIET - INCREASE WATER INTAKE    . Exercise 150 min/wk Moderate Activity      Depression Screen PHQ 2/9 Scores 07/28/2017  07/21/2017 05/21/2017 02/27/2017  PHQ - 2 Score 0 0 0 0    Fall Risk Fall Risk  07/28/2017 07/21/2017 05/21/2017 02/27/2017 10/20/2016  Falls in the past year? No No No No No    Cognitive Function: MMSE - Mini Mental State Exam 07/28/2017  Not completed: Unable to complete  Unable to complete due to literacy level. Patient was able to take the mini cog and scored 100%. Completely normal exam.       Screening Tests Health Maintenance  Topic Date Due  . TETANUS/TDAP  10/27/2017 (Originally 06/13/1969)  . Hepatitis C  Screening  07/29/2018 (Originally [birth year])  . INFLUENZA VACCINE  11/12/2017  . PNA vac Low Risk Adult (2 of 2 - PCV13) 05/21/2018  . COLONOSCOPY  10/28/2018  Postponed Tdap due to cost.    Cancer Screenings: Lung: Low Dose CT Chest recommended if Age 30-80 years, 30 pack-year currently smoking OR have quit w/in 15years. Patient does not qualify. Colorectal: due 10/2018. Has received a reminder letter from [facility] to schedule.   Additional Screenings:  Hepatitis C Screening: discussed having this drawn with next labwork.       Plan:   Keep f/u with PCP tomorrow concerning back pain Increase water intake. Aim for at least 4 to 6 bottles a day.  Aim for at least 150 minutes of moderate activity a week.  Move carefully to avoid falls and injury.  Recommended TD if he has an injury.   I have personally reviewed and noted the following in the patient's chart:   . Medical and social history . Use of alcohol, tobacco or illicit drugs  . Current medications and supplements . Functional ability and status . Nutritional status . Physical activity . Advanced directives . List of other physicians . Hospitalizations, surgeries, and ER visits in previous 12 months . Vitals . Screenings to include cognitive, depression, and falls . Referrals and appointments  In addition, I have reviewed and discussed with patient certain preventive protocols, quality metrics, and best practice  recommendations. A written personalized care plan for preventive services as well as general preventive health recommendations were provided to patient.     Chong Sicilian, RN   07/28/2017    I have reviewed and agree with the above AWV documentation.   Laroy Apple, MD [facility] 07/28/2017, 1:58 PM

## 2017-07-29 ENCOUNTER — Ambulatory Visit: Payer: Medicare Other | Admitting: Family Medicine

## 2017-07-29 ENCOUNTER — Encounter: Payer: Self-pay | Admitting: Family Medicine

## 2017-07-29 ENCOUNTER — Ambulatory Visit (INDEPENDENT_AMBULATORY_CARE_PROVIDER_SITE_OTHER): Payer: Medicare Other | Admitting: Family Medicine

## 2017-07-29 VITALS — BP 123/72 | HR 82 | Temp 97.6°F | Ht 68.5 in | Wt 150.8 lb

## 2017-07-29 DIAGNOSIS — M5441 Lumbago with sciatica, right side: Secondary | ICD-10-CM

## 2017-07-29 MED ORDER — NAPROXEN 500 MG PO TABS: 500.0000 mg | ORAL_TABLET | Freq: Two times a day (BID) | ORAL | 0 refills | Status: DC

## 2017-07-29 MED ORDER — OXYCODONE-ACETAMINOPHEN 5-325 MG PO TABS: 1.0000 | ORAL_TABLET | ORAL | 0 refills | Status: DC | PRN

## 2017-07-29 NOTE — Patient Instructions (Signed)
Great to see you!  Start naproxen 1 pill twice daily with food, do not take any Aleve or ibuprofen with this medication.   Use oxycodone as needed, do not drive after you take it.  We will work on a referral to orthopedics for you.

## 2017-07-29 NOTE — Progress Notes (Signed)
   HPI  Patient presents today here for continued back pain.  Patient's now had back pain for 3-4 weeks. He describes bilateral low back pain with right-sided sciatica down to the mid calf. He denies any leg weakness, numbness.  He does have some tingling on the leg.  No bowel or bladder dysfunction.  He is tried prednisone course with no improvement.  He is also tried NSAIDs and a small dose of narcotic pain medication that he had at home.  He states that none of the medications have made much of a difference.  PMH: Smoking status noted ROS: Per HPI  Objective: BP 123/72   Pulse 82   Temp 97.6 F (36.4 C) (Oral)   Ht 5' 8.5" (1.74 m)   Wt 150 lb 12.8 oz (68.4 kg)   BMI 22.60 kg/m  Gen: NAD, alert, cooperative with exam HEENT: NCAT CV: RRR, good S1/S2, no murmur Resp: CTABL, no wheezes, non-labored Ext: No edema, warm Neuro: Alert and oriented, No gross deficits MSK:  Tenderness to palpation of bilateral low back paraspinal muscles and midline spine Negative for straight leg raise Negative logroll and Faber  Assessment and plan:  #Right-sided low back pain with sciatica Patient with continued pain now for several weeks No red flags on exam No history of heart disease, recommended scheduled NSAIDs plus oxycodone as needed Refer to orthopedic surgery   Orders Placed This Encounter  Procedures  . Ambulatory referral to Orthopedic Surgery    Referral Priority:   Routine    Referral Type:   Surgical    Referral Reason:   Specialty Services Required    Requested Specialty:   Orthopedic Surgery    Number of Visits Requested:   1    Meds ordered this encounter  Medications  . naproxen (NAPROSYN) 500 MG tablet    Sig: Take 1 tablet (500 mg total) by mouth 2 (two) times daily with a meal.    Dispense:  30 tablet    Refill:  0  . oxyCODONE-acetaminophen (PERCOCET) 5-325 MG tablet    Sig: Take 1 tablet by mouth every 4 (four) hours as needed for severe pain.   Dispense:  30 tablet    Refill:  0    Laroy Apple, MD [facility] 07/29/2017, 10:13 AM

## 2017-08-12 DIAGNOSIS — H2513 Age-related nuclear cataract, bilateral: Secondary | ICD-10-CM | POA: Diagnosis not present

## 2017-08-12 DIAGNOSIS — H16103 Unspecified superficial keratitis, bilateral: Secondary | ICD-10-CM | POA: Diagnosis not present

## 2017-08-19 DIAGNOSIS — H2513 Age-related nuclear cataract, bilateral: Secondary | ICD-10-CM | POA: Diagnosis not present

## 2017-08-19 DIAGNOSIS — H16103 Unspecified superficial keratitis, bilateral: Secondary | ICD-10-CM | POA: Diagnosis not present

## 2017-09-04 DIAGNOSIS — H2513 Age-related nuclear cataract, bilateral: Secondary | ICD-10-CM | POA: Diagnosis not present

## 2017-09-04 DIAGNOSIS — H16103 Unspecified superficial keratitis, bilateral: Secondary | ICD-10-CM | POA: Diagnosis not present

## 2017-11-16 ENCOUNTER — Other Ambulatory Visit: Payer: Medicare Other

## 2017-11-16 ENCOUNTER — Other Ambulatory Visit: Payer: Self-pay | Admitting: Family Medicine

## 2017-11-16 DIAGNOSIS — Z131 Encounter for screening for diabetes mellitus: Secondary | ICD-10-CM

## 2017-11-16 DIAGNOSIS — E785 Hyperlipidemia, unspecified: Secondary | ICD-10-CM

## 2017-11-16 NOTE — Progress Notes (Signed)
[place]

## 2017-11-16 NOTE — Progress Notes (Unsigned)
Placed labs for patient to do before visit

## 2017-11-17 LAB — CBC WITH DIFFERENTIAL/PLATELET
BASOS: 1 %
Basophils Absolute: 0.1 10*3/uL (ref 0.0–0.2)
EOS (ABSOLUTE): 0.6 10*3/uL — AB (ref 0.0–0.4)
EOS: 6 %
Hematocrit: 46.3 % (ref 37.5–51.0)
Hemoglobin: 15.1 g/dL (ref 13.0–17.7)
Immature Grans (Abs): 0.1 10*3/uL (ref 0.0–0.1)
Immature Granulocytes: 1 %
LYMPHS ABS: 3.5 10*3/uL — AB (ref 0.7–3.1)
Lymphs: 33 %
MCH: 30.2 pg (ref 26.6–33.0)
MCHC: 32.6 g/dL (ref 31.5–35.7)
MCV: 93 fL (ref 79–97)
MONOS ABS: 0.8 10*3/uL (ref 0.1–0.9)
Monocytes: 8 %
Neutrophils Absolute: 5.6 10*3/uL (ref 1.4–7.0)
Neutrophils: 51 %
Platelets: 214 10*3/uL (ref 150–450)
RBC: 5 x10E6/uL (ref 4.14–5.80)
RDW: 14.2 % (ref 12.3–15.4)
WBC: 10.6 10*3/uL (ref 3.4–10.8)

## 2017-11-17 LAB — LIPID PANEL
Chol/HDL Ratio: 5.3 ratio — ABNORMAL HIGH (ref 0.0–5.0)
Cholesterol, Total: 149 mg/dL (ref 100–199)
HDL: 28 mg/dL — ABNORMAL LOW (ref >39)
LDL CALC: 80 mg/dL (ref 0–99)
TRIGLYCERIDES: 204 mg/dL — AB (ref 0–149)
VLDL CHOLESTEROL CAL: 41 mg/dL — AB (ref 5–40)

## 2017-11-17 LAB — CMP14+EGFR
A/G RATIO: 1.7 (ref 1.2–2.2)
ALBUMIN: 4.3 g/dL (ref 3.6–4.8)
ALK PHOS: 89 IU/L (ref 39–117)
ALT: 11 IU/L (ref 0–44)
AST: 18 IU/L (ref 0–40)
BUN / CREAT RATIO: 13 (ref 10–24)
BUN: 14 mg/dL (ref 8–27)
Bilirubin Total: <0.2 mg/dL (ref 0.0–1.2)
CO2: 23 mmol/L (ref 20–29)
Calcium: 9.4 mg/dL (ref 8.6–10.2)
Chloride: 105 mmol/L (ref 96–106)
Creatinine, Ser: 1.08 mg/dL (ref 0.76–1.27)
GFR calc Af Amer: 81 mL/min/{1.73_m2} (ref >59)
GFR, EST NON AFRICAN AMERICAN: 70 mL/min/{1.73_m2} (ref >59)
GLOBULIN, TOTAL: 2.5 g/dL (ref 1.5–4.5)
Glucose: 92 mg/dL (ref 65–99)
POTASSIUM: 4.5 mmol/L (ref 3.5–5.2)
SODIUM: 142 mmol/L (ref 134–144)
Total Protein: 6.8 g/dL (ref 6.0–8.5)

## 2017-11-19 ENCOUNTER — Ambulatory Visit (INDEPENDENT_AMBULATORY_CARE_PROVIDER_SITE_OTHER): Payer: Medicare Other | Admitting: Family Medicine

## 2017-11-19 ENCOUNTER — Encounter: Payer: Self-pay | Admitting: Family Medicine

## 2017-11-19 VITALS — BP 107/74 | HR 71 | Temp 97.3°F | Ht 68.5 in | Wt 151.8 lb

## 2017-11-19 DIAGNOSIS — K219 Gastro-esophageal reflux disease without esophagitis: Secondary | ICD-10-CM | POA: Diagnosis not present

## 2017-11-19 DIAGNOSIS — Z131 Encounter for screening for diabetes mellitus: Secondary | ICD-10-CM | POA: Diagnosis not present

## 2017-11-19 DIAGNOSIS — E785 Hyperlipidemia, unspecified: Secondary | ICD-10-CM

## 2017-11-19 MED ORDER — ICOSAPENT ETHYL 1 G PO CAPS: 1.0000 g | ORAL_CAPSULE | Freq: Two times a day (BID) | ORAL | 11 refills | Status: DC

## 2017-11-19 NOTE — Progress Notes (Signed)
BP 107/74   Pulse 71   Temp (!) 97.3 F (36.3 C) (Oral)   Ht 5' 8.5" (1.74 m)   Wt 151 lb 12.8 oz (68.9 kg)   BMI 22.75 kg/m    Subjective:    Patient ID: Jonathan Burnett, male    DOB: 01/10/1951, 67 y.o.   MRN: [id number]  HPI: Jonathan Burnett is a 67 y.o. male presenting on 11/19/2017 for check up of chronic medical conditons and Establish Care Wendi Snipes pt)   HPI Hyperlipidemia Patient is coming in for recheck of his hyperlipidemia. The patient is currently taking Vascepa. They deny any issues with myalgias or history of liver damage from it. They deny any focal numbness or weakness or chest pain.  Patient's triglycerides have greatly improved from previous.  GERD Patient is currently on Zantac as needed over-the-counter.  She denies any major symptoms or abdominal pain or belching or burping. She denies any blood in her stool or lightheadedness or dizziness.   Relevant past medical, surgical, family and social history reviewed and updated as indicated. Interim medical history since our last visit reviewed. Allergies and medications reviewed and updated.  Review of Systems  Constitutional: Negative for chills and fever.  HENT: Negative for ear pain and tinnitus.   Respiratory: Negative for cough, shortness of breath and wheezing.   Cardiovascular: Negative for chest pain, palpitations and leg swelling.  Gastrointestinal: Negative for abdominal pain, blood in stool, constipation and diarrhea.  Genitourinary: Negative for dysuria and hematuria.  Musculoskeletal: Negative for back pain, gait problem and myalgias.  Skin: Negative for rash.  Neurological: Negative for dizziness, weakness and headaches.  Psychiatric/Behavioral: Negative for suicidal ideas.  All other systems reviewed and are negative.   Per HPI unless specifically indicated above   Allergies as of 11/19/2017      Reactions   Aspirin    REACTION: GI upset/nausea      Medication List        Accurate as of  11/19/17  9:06 AM. Always use your most recent med list.          fluticasone 50 MCG/ACT nasal spray Commonly known as:  FLONASE Place 1 spray 2 (two) times daily as needed into both nostrils for allergies or rhinitis.   Icosapent Ethyl 1 g Caps Take 1 capsule (1 g total) by mouth 2 (two) times daily.          Objective:    BP 107/74   Pulse 71   Temp (!) 97.3 F (36.3 C) (Oral)   Ht 5' 8.5" (1.74 m)   Wt 151 lb 12.8 oz (68.9 kg)   BMI 22.75 kg/m   Wt Readings from Last 3 Encounters:  11/19/17 151 lb 12.8 oz (68.9 kg)  07/29/17 150 lb 12.8 oz (68.4 kg)  07/28/17 152 lb (68.9 kg)    Physical Exam  Constitutional: He is oriented to person, place, and time. He appears well-developed and well-nourished. No distress.  Eyes: Conjunctivae are normal. No scleral icterus.  Neck: Neck supple. No thyromegaly present.  Cardiovascular: Normal rate, regular rhythm, normal heart sounds and intact distal pulses.  No murmur heard. Pulmonary/Chest: Effort normal and breath sounds normal. No respiratory distress. He has no wheezes.  Abdominal: Soft. Bowel sounds are normal. He exhibits no distension and no mass. There is no tenderness. There is no guarding.  Musculoskeletal: Normal range of motion. He exhibits no edema.  Lymphadenopathy:    He has no cervical adenopathy.  Neurological: He is alert  and oriented to person, place, and time. Coordination normal.  Skin: Skin is warm and dry. No rash noted. He is not diaphoretic.  Psychiatric: He has a normal mood and affect. His behavior is normal.  Nursing note and vitals reviewed.   Results for orders placed or performed in visit on 11/16/17  Lipid panel  Result Value Ref Range   Cholesterol, Total 149 100 - 199 mg/dL   Triglycerides 204 (H) 0 - 149 mg/dL   HDL 28 (L) >39 mg/dL   VLDL Cholesterol Cal 41 (H) 5 - 40 mg/dL   LDL Calculated 80 0 - 99 mg/dL   Chol/HDL Ratio 5.3 (H) 0.0 - 5.0 ratio  CMP14+EGFR  Result Value Ref Range    Glucose 92 65 - 99 mg/dL   BUN 14 8 - 27 mg/dL   Creatinine, Ser 1.08 0.76 - 1.27 mg/dL   GFR calc non Af Amer 70 >59 mL/min/1.73   GFR calc Af Amer 81 >59 mL/min/1.73   BUN/Creatinine Ratio 13 10 - 24   Sodium 142 134 - 144 mmol/L   Potassium 4.5 3.5 - 5.2 mmol/L   Chloride 105 96 - 106 mmol/L   CO2 23 20 - 29 mmol/L   Calcium 9.4 8.6 - 10.2 mg/dL   Total Protein 6.8 6.0 - 8.5 g/dL   Albumin 4.3 3.6 - 4.8 g/dL   Globulin, Total 2.5 1.5 - 4.5 g/dL   Albumin/Globulin Ratio 1.7 1.2 - 2.2   Bilirubin Total <0.2 0.0 - 1.2 mg/dL   Alkaline Phosphatase 89 39 - 117 IU/L   AST 18 0 - 40 IU/L   ALT 11 0 - 44 IU/L  CBC with Differential/Platelet  Result Value Ref Range   WBC 10.6 3.4 - 10.8 x10E3/uL   RBC 5.00 4.14 - 5.80 x10E6/uL   Hemoglobin 15.1 13.0 - 17.7 g/dL   Hematocrit 46.3 37.5 - 51.0 %   MCV 93 79 - 97 fL   MCH 30.2 26.6 - 33.0 pg   MCHC 32.6 31.5 - 35.7 g/dL   RDW 14.2 12.3 - 15.4 %   Platelets 214 150 - 450 x10E3/uL   Neutrophils 51 Not Estab. %   Lymphs 33 Not Estab. %   Monocytes 8 Not Estab. %   Eos 6 Not Estab. %   Basos 1 Not Estab. %   Neutrophils Absolute 5.6 1.4 - 7.0 x10E3/uL   Lymphocytes Absolute 3.5 (H) 0.7 - 3.1 x10E3/uL   Monocytes Absolute 0.8 0.1 - 0.9 x10E3/uL   EOS (ABSOLUTE) 0.6 (H) 0.0 - 0.4 x10E3/uL   Basophils Absolute 0.1 0.0 - 0.2 x10E3/uL   Immature Granulocytes 1 Not Estab. %   Immature Grans (Abs) 0.1 0.0 - 0.1 x10E3/uL      Assessment & Plan:   Problem List Items Addressed This Visit      Digestive   GERD (gastroesophageal reflux disease)     Other   HLD (hyperlipidemia) - Primary   Relevant Medications   Icosapent Ethyl (VASCEPA) 1 g CAPS    Other Visit Diagnoses    Diabetes mellitus screening          Patient's triglycerides have improved greatly with Vascepa and he takes Zantac as needed for GERD  Follow up plan: Return in about 1 year (around 11/20/2018), or if symptoms worsen or fail to improve, for Hyperlipidemia and  GERD recheck.  Counseling provided for all of the vaccine components No orders of the defined types were placed in this encounter.  Caryl Pina, MD [facility] 11/19/2017, 9:06 AM

## 2018-01-06 ENCOUNTER — Ambulatory Visit (INDEPENDENT_AMBULATORY_CARE_PROVIDER_SITE_OTHER): Payer: Medicare Other

## 2018-01-06 DIAGNOSIS — Z23 Encounter for immunization: Secondary | ICD-10-CM | POA: Diagnosis not present

## 2018-01-06 NOTE — Patient Instructions (Signed)
Diphtheria/Tetanus Toxoids; Pertussis Vaccine, DTP injection What is this medicine? DIPHTHERIA and TETANUS TOXOIDS; PERTUSSIS VACCINE (dif THEER ee uh and TET n [place] TOK soids; per TUS iss VAK seen) is used to prevent diphtheria, tetanus, and pertussis infections. This medicine may be used for other purposes; ask your health care provider or pharmacist if you have questions. COMMON BRAND NAME(S): Adacel, Boostrix, Certiva, Daptacel, Infanrix, Tripedia What should I tell my health care provider before I take this medicine? They need to know if you have any of these conditions: -blood disorders like hemophilia -fever or infection -immune system problems -neurologic disease -seizures -an unusual or allergic reaction to vaccines, thimerosal, latex, other medicines, foods, dyes, or preservatives -pregnant or trying to get pregnant -breast-feeding How should I use this medicine? This vaccine is for injection into a muscle. It is given by a health care professional. A copy of Vaccine Information Statements will be given before each vaccination. Read this sheet carefully each time. The sheet may change frequently. Talk to your pediatrician regarding the use of this vaccine in children. While the DTP vaccine may be given to children ages 71 weeks to 7 years and the Tdap vaccine may be given to children at least 23 years old, precautions do apply. Overdosage: If you think you have taken too much of this medicine contact a poison control center or emergency room at once. NOTE: This medicine is only for you. Do not share this medicine with others. What if I miss a dose? It is important not to miss your dose. Call your doctor or health care professional if you are unable to keep an appointment. What may interact with this medicine? -immune globulin -medicines that suppress your immune function like adalimumab, anakinra, infliximab -medicines to treat cancer -medicines that treat or prevent blood clots  like warfarin, enoxaparin, and dalteparin -steroid medicines like prednisone or cortisone This list may not describe all possible interactions. Give your health care provider a list of all the medicines, herbs, non-prescription drugs, or dietary supplements you use. Also tell them if you smoke, drink alcohol, or use illegal drugs. Some items may interact with your medicine. What should I watch for while using this medicine? See your health care provider for all shots of this vaccine as directed. To have protection from infection, you must have 3 shots of this vaccine plus boosters as needed. Tell your doctor right away if you have any serious or unusual side effects after getting this vaccine. What side effects may I notice from receiving this medicine? Side effects that you should report to your doctor or health care professional as soon as possible: -allergic reactions like skin rash, itching or hives, swelling of the face, lips, or tongue -breathing problems -fever of 103 degrees F or more -flu-like symptoms -inconsolable crying -infection -pain, tingling, numbness in the hands or feet -seizures -swelling of arm or leg that was injected -unusually weak or tired Side effects that usually do not require medical attention (report to your doctor or health care professional if they continue or are bothersome): -fussy, irritable -loss of appetite -low-grade fever -pain, tenderness, redness, swelling, or a 'knot' at site where injected -vomiting This list may not describe all possible side effects. Call your doctor for medical advice about side effects. You may report side effects to FDA at 1-800-FDA-1088. Where should I keep my medicine? This drug is given in a hospital or clinic and will not be stored at home. NOTE: This sheet is a summary. It  may not cover all possible information. If you have questions about this medicine, talk to your doctor, pharmacist, or health care provider.  2018  Elsevier/Gold Standard (2015-05-03 12:43:42)  Influenza Virus Vaccine injection What is this medicine? INFLUENZA VIRUS VACCINE (in floo EN zuh VAHY ruhs vak SEEN) helps to reduce the risk of getting influenza also known as the flu. The vaccine only helps protect you against some strains of the flu. This medicine may be used for other purposes; ask your health care provider or pharmacist if you have questions. COMMON BRAND NAME(S): Afluria, Agriflu, Alfuria, FLUAD, Fluarix, Fluarix Quadrivalent, Flublok, Flublok Quadrivalent, FLUCELVAX, Flulaval, Fluvirin, Fluzone, Fluzone High-Dose, Fluzone Intradermal What should I tell my health care provider before I take this medicine? They need to know if you have any of these conditions: -bleeding disorder like hemophilia -fever or infection -Guillain-Barre syndrome or other neurological problems -immune system problems -infection with the human immunodeficiency virus (HIV) or AIDS -low blood platelet counts -multiple sclerosis -an unusual or allergic reaction to influenza virus vaccine, latex, other medicines, foods, dyes, or preservatives. Different brands of vaccines contain different allergens. Some may contain latex or eggs. Talk to your doctor about your allergies to make sure that you get the right vaccine. -pregnant or trying to get pregnant -breast-feeding How should I use this medicine? This vaccine is for injection into a muscle or under the skin. It is given by a health care professional. A copy of Vaccine Information Statements will be given before each vaccination. Read this sheet carefully each time. The sheet may change frequently. Talk to your healthcare provider to see which vaccines are right for you. Some vaccines should not be used in all age groups. Overdosage: If you think you have taken too much of this medicine contact a poison control center or emergency room at once. NOTE: This medicine is only for you. Do not share this  medicine with others. What if I miss a dose? This does not apply. What may interact with this medicine? -chemotherapy or radiation therapy -medicines that lower your immune system like etanercept, anakinra, infliximab, and adalimumab -medicines that treat or prevent blood clots like warfarin -phenytoin -steroid medicines like prednisone or cortisone -theophylline -vaccines This list may not describe all possible interactions. Give your health care provider a list of all the medicines, herbs, non-prescription drugs, or dietary supplements you use. Also tell them if you smoke, drink alcohol, or use illegal drugs. Some items may interact with your medicine. What should I watch for while using this medicine? Report any side effects that do not go away within 3 days to your doctor or health care professional. Call your health care provider if any unusual symptoms occur within 6 weeks of receiving this vaccine. You may still catch the flu, but the illness is not usually as bad. You cannot get the flu from the vaccine. The vaccine will not protect against colds or other illnesses that may cause fever. The vaccine is needed every year. What side effects may I notice from receiving this medicine? Side effects that you should report to your doctor or health care professional as soon as possible: -allergic reactions like skin rash, itching or hives, swelling of the face, lips, or tongue Side effects that usually do not require medical attention (report to your doctor or health care professional if they continue or are bothersome): -fever -headache -muscle aches and pains -pain, tenderness, redness, or swelling at the injection site -tiredness This list may not describe all  possible side effects. Call your doctor for medical advice about side effects. You may report side effects to FDA at 1-800-FDA-1088. Where should I keep my medicine? The vaccine will be given by a health care professional in a  clinic, pharmacy, doctor's office, or other health care setting. You will not be given vaccine doses to store at home. NOTE: This sheet is a summary. It may not cover all possible information. If you have questions about this medicine, talk to your doctor, pharmacist, or health care provider.  2018 Elsevier/Gold Standard (2014-10-20 10:07:28)

## 2018-01-06 NOTE — Progress Notes (Signed)
Influenza vaccine given to left deltoid, Boostrix given to right deltoid.  Patient tolerated well

## 2018-03-08 DIAGNOSIS — H16103 Unspecified superficial keratitis, bilateral: Secondary | ICD-10-CM | POA: Diagnosis not present

## 2018-03-08 DIAGNOSIS — H2513 Age-related nuclear cataract, bilateral: Secondary | ICD-10-CM | POA: Diagnosis not present

## 2018-04-21 DIAGNOSIS — H2513 Age-related nuclear cataract, bilateral: Secondary | ICD-10-CM | POA: Diagnosis not present

## 2018-04-21 DIAGNOSIS — H16103 Unspecified superficial keratitis, bilateral: Secondary | ICD-10-CM | POA: Diagnosis not present

## 2018-05-05 ENCOUNTER — Ambulatory Visit (INDEPENDENT_AMBULATORY_CARE_PROVIDER_SITE_OTHER): Payer: Medicare Other | Admitting: Family Medicine

## 2018-05-05 ENCOUNTER — Encounter: Payer: Self-pay | Admitting: Family Medicine

## 2018-05-05 VITALS — BP 113/71 | HR 71 | Temp 97.6°F | Ht 68.5 in | Wt 155.2 lb

## 2018-05-05 DIAGNOSIS — R42 Dizziness and giddiness: Secondary | ICD-10-CM

## 2018-05-05 DIAGNOSIS — R2689 Other abnormalities of gait and mobility: Secondary | ICD-10-CM | POA: Diagnosis not present

## 2018-05-05 NOTE — Progress Notes (Signed)
BP 113/71   Pulse 71   Temp 97.6 F (36.4 C) (Oral)   Ht 5' 8.5" (1.74 m)   Wt 155 lb 3.2 oz (70.4 kg)   BMI 23.25 kg/m    Subjective:    Patient ID: Jonathan Burnett, male    DOB: 12/26/1950, 68 y.o.   MRN: [id number]  HPI: Jonathan Burnett is a 68 y.o. male presenting on 05/05/2018 for off balance (Patient states it has been ongoing but has gotten worse.)   HPI Dizziness and balance Patient comes in today with complaints of dizziness and balance issues.  He says mostly just feels like he is off balance and sometimes when he is doing work like reaches for something or sometimes when he bends down and then comes back up he feels like he is going to fall over.  He denies feeling faint or denies any spinning sensation.  Patient denies any chest pain or trouble breathing.  He denies any numbness or weakness.  He is able to walk up and down the hall and walk up and down stairs without any issues.  He says it does not happen every day.  He thinks it is been going on for well over a year maybe even 2 but it has become more frequent over the past few months.  His family finally convinced him to come in and be evaluated for this  Relevant past medical, surgical, family and social history reviewed and updated as indicated. Interim medical history since our last visit reviewed. Allergies and medications reviewed and updated.  Review of Systems  Constitutional: Negative for chills and fever.  Respiratory: Negative for shortness of breath and wheezing.   Cardiovascular: Negative for chest pain and leg swelling.  Musculoskeletal: Negative for back pain and gait problem.  Skin: Negative for rash.  Neurological: Positive for dizziness. Negative for syncope, speech difficulty, weakness, light-headedness, numbness and headaches.  All other systems reviewed and are negative.   Per HPI unless specifically indicated above   Allergies as of 05/05/2018      Reactions   Aspirin    REACTION: GI upset/nausea        Medication List       Accurate as of May 05, 2018  2:05 PM. Always use your most recent med list.        fluticasone 50 MCG/ACT nasal spray Commonly known as:  FLONASE Place 1 spray 2 (two) times daily as needed into both nostrils for allergies or rhinitis.   Icosapent Ethyl 1 g Caps Commonly known as:  VASCEPA Take 1 capsule (1 g total) by mouth 2 (two) times daily.          Objective:    BP 113/71   Pulse 71   Temp 97.6 F (36.4 C) (Oral)   Ht 5' 8.5" (1.74 m)   Wt 155 lb 3.2 oz (70.4 kg)   BMI 23.25 kg/m   Wt Readings from Last 3 Encounters:  05/05/18 155 lb 3.2 oz (70.4 kg)  11/19/17 151 lb 12.8 oz (68.9 kg)  07/29/17 150 lb 12.8 oz (68.4 kg)    Physical Exam Vitals signs and nursing note reviewed.  Constitutional:      General: He is not in acute distress.    Appearance: He is well-developed. He is not diaphoretic.  Eyes:     General: No scleral icterus.    Conjunctiva/sclera: Conjunctivae normal.  Neck:     Musculoskeletal: Neck supple.     Thyroid: No thyromegaly.  Cardiovascular:     Rate and Rhythm: Normal rate and regular rhythm.     Heart sounds: Normal heart sounds. No murmur.  Pulmonary:     Effort: Pulmonary effort is normal. No respiratory distress.     Breath sounds: Normal breath sounds. No wheezing.  Musculoskeletal: Normal range of motion.     Comments: Patient has a normal gait and get up and go test and has a good stride without any sway and can walk in a straight line without any deviation.  He is able to get up and down from the exam table without any issue.  He says it only happens every now and then.  Lymphadenopathy:     Cervical: No cervical adenopathy.  Skin:    General: Skin is warm and dry.     Findings: No rash.  Neurological:     General: No focal deficit present.     Mental Status: He is alert and oriented to person, place, and time.     Cranial Nerves: No cranial nerve deficit.     Sensory: No sensory deficit.      Motor: No weakness.     Coordination: Coordination normal.     Gait: Gait normal.  Psychiatric:        Behavior: Behavior normal.         Assessment & Plan:   Problem List Items Addressed This Visit    None    Visit Diagnoses    Balance problem    -  Primary   Relevant Orders   Ambulatory referral to Neurology   Dizziness       Relevant Orders   Ambulatory referral to Neurology      We will do a neurology referral for further balance testing, if they do not find anything wrong we will consider sending him to therapy Follow up plan: Return if symptoms worsen or fail to improve.  Counseling provided for all of the vaccine components Orders Placed This Encounter  Procedures  . Ambulatory referral to Neurology    Caryl Pina, MD [facility] 05/05/2018, 2:05 PM

## 2018-05-07 ENCOUNTER — Encounter: Payer: Self-pay | Admitting: Neurology

## 2018-05-12 ENCOUNTER — Ambulatory Visit: Payer: Medicare Other | Admitting: Family Medicine

## 2018-05-26 NOTE — Progress Notes (Signed)
NEUROLOGY CONSULTATION NOTE  [name] MRN: [id number] DOB: 07-08-1950  Referring provider: Caryl Pina, MD Primary care provider: Caryl Pina, MD  Reason for consult:  Dizziness, balance problems  HISTORY OF PRESENT ILLNESS: Jonathan Burnett is a 68 year old right-handed man with hyperlipidemia and diabetes who presents for dizziness and balance problems.  He is accompanied by his wife who supplements history.    He has had problems with balance for over a year and has been consistent and unchanged.  When he is sitting, he feels fine.  It occurs with sudden movements such as If he bends over or stands up, he feels spinning lasting a minute.  It may occur looking down while walking up stairs or if he turns around a chair, he may stagger a bit.  Once he is walking straight, he feels okay.  No associated nausea, vomiting, double vision, acute change in hearing or unilateral numbness or weakness.  He has chronic tinnitus.  PAST MEDICAL HISTORY: Past Medical History:  Diagnosis Date  . Diverticulosis   . Hyperlipidemia   . Hypoglycemia     PAST SURGICAL HISTORY: Past Surgical History:  Procedure Laterality Date  . APPENDECTOMY    . gallstones      MEDICATIONS: Current Outpatient Medications on File Prior to Visit  Medication Sig Dispense Refill  . fluticasone (FLONASE) 50 MCG/ACT nasal spray Place 1 spray 2 (two) times daily as needed into both nostrils for allergies or rhinitis. 16 g 6  . Icosapent Ethyl (VASCEPA) 1 g CAPS Take 1 capsule (1 g total) by mouth 2 (two) times daily. 60 capsule 11   No current facility-administered medications on file prior to visit.     ALLERGIES: Allergies  Allergen Reactions  . Aspirin     REACTION: GI upset/nausea    FAMILY HISTORY: Family History  Problem Relation Age of Onset  . Cancer Father        lung cancer  . Hypertension Father   . Cancer Brother        stomach  . Healthy Sister   . Healthy Daughter   . Healthy  Brother   . Healthy Brother   . Healthy Brother   . Healthy Sister   . Healthy Daughter    SOCIAL HISTORY: Social History   Socioeconomic History  . Marital status: Married    Spouse name: Not on file  . Number of children: 2  . Years of education: 10  . Highest education level: 10th grade  Occupational History  . 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 Financial resource strain: Somewhat hard  . Food insecurity:    Worry: Never true    Inability: Never true  . Transportation needs:    Medical: No    Non-medical: No  Tobacco Use  . Smoking status: Never Smoker  . Smokeless tobacco: Never Used  Substance and Sexual Activity  . Alcohol use: No  . Drug use: No  . Sexual activity: Yes  Lifestyle  . Physical activity:    Days per week: 7 days    Minutes per session: 60 min  . Stress: Not at all  Relationships  . Social connections:    Talks on phone: Never    Gets together: Never    Attends religious service: Never    Active member of club or organization: No    Attends meetings of clubs or organizations: Never    Relationship status: Widowed  .  Intimate partner violence:    Fear of current or ex partner: No    Emotionally abused: No    Physically abused: No    Forced sexual activity: No  Other Topics Concern  . Not on file  Social History Narrative  . Not on file    REVIEW OF SYSTEMS: Constitutional: No fevers, chills, or sweats, no generalized fatigue, change in appetite Eyes: No visual changes, double vision, eye pain Ear, nose and throat: No hearing loss, ear pain, nasal congestion, sore throat Cardiovascular: No chest pain, palpitations Respiratory:  No shortness of breath at rest or with exertion, wheezes GastrointestinaI: No nausea, vomiting, diarrhea, abdominal pain, fecal incontinence Genitourinary:  No dysuria, urinary retention or frequency Musculoskeletal:  No neck pain, back pain Integumentary:  No rash, pruritus, skin lesions Neurological: as above Psychiatric: No depression, insomnia, anxiety Endocrine: No palpitations, fatigue, diaphoresis, mood swings, change in appetite, change in weight, increased thirst Hematologic/Lymphatic:  No purpura, petechiae. Allergic/Immunologic: no itchy/runny eyes, nasal congestion, recent allergic reactions, rashes  PHYSICAL EXAM: Blood pressure 102/72, pulse 85, height 5\' 11"  (1.803 m), weight 155 lb (70.3 kg), SpO2 95 %. General: No acute distress.  Patient appears well-groomed.   Head:  Normocephalic/atraumatic Eyes:  fundi examined but not visualized Neck: supple, no paraspinal tenderness, full range of motion Back: No paraspinal tenderness Heart: regular rate and rhythm Lungs: Clear to auscultation bilaterally. Vascular: No carotid bruits. Neurological Exam: Mental status: alert and oriented to person, place, and time, recent and remote memory intact, fund of knowledge intact, attention and concentration intact, speech fluent and not dysarthric, language intact. Cranial nerves: CN I: not tested CN II: pupils equal, round and reactive to light, visual fields intact CN III, IV, VI:  full range of motion, no nystagmus, no ptosis CN V: facial sensation intact CN VII: upper and lower face symmetric CN VIII: hearing intact CN IX, X: gag intact, uvula midline CN XI: sternocleidomastoid and trapezius muscles intact CN XII: tongue midline Bulk & Tone: normal, no fasciculations. Motor:  5/5 throughout  Sensation: temperature and vibration sensation intact. Deep Tendon Reflexes:  2+ throughout, toes downgoing.   Finger to nose testing:  Without dysmetria.   Heel to shin:  Without dysmetria.   Gait:  Normal station and stride.  Able to turn and tandem walk. Romberg negative. Dix-Hallpike:  Negative; Head Impulse Test:  Positive bilaterally  IMPRESSION: Probable benign paroxysmal positional vertigo.  Semiology characteristic.  Neurologic exam  without focal or lateralizing abnormalities.  While Dix-Hallpike was negative, he had a positive Head Impulse Test.  PLAN: 1.  Refer for vestibular rehabilitation 2.  Follow up afterwards.  If dizziness not resolved, will pursue other testing (such as MRI of head)  Thank you for allowing me to take part in the care of this patient.  40 minutes spent face to face with patient, over 50% spent discussing diagnosis and management.  Metta Clines, DO  CC:  Caryl Pina, MD

## 2018-05-28 ENCOUNTER — Encounter: Payer: Self-pay | Admitting: Neurology

## 2018-05-28 ENCOUNTER — Ambulatory Visit: Payer: Medicare Other | Admitting: Neurology

## 2018-05-28 VITALS — BP 102/72 | HR 85 | Ht 71.0 in | Wt 155.0 lb

## 2018-05-28 DIAGNOSIS — H8113 Benign paroxysmal vertigo, bilateral: Secondary | ICD-10-CM

## 2018-05-28 NOTE — Patient Instructions (Signed)
I think the dizziness is due to an inner ear issue.  I will refer you to a physical therapist to perform exercises to help treat the dizziness.  Follow up afterward.  If dizziness not better, we will perform other testing.

## 2018-06-08 ENCOUNTER — Ambulatory Visit (HOSPITAL_COMMUNITY): Payer: Medicare Other | Attending: Physical Therapy

## 2018-06-17 ENCOUNTER — Ambulatory Visit (HOSPITAL_COMMUNITY): Payer: Medicare Other | Attending: Family Medicine | Admitting: Physical Therapy

## 2018-06-17 ENCOUNTER — Other Ambulatory Visit: Payer: Self-pay

## 2018-06-17 ENCOUNTER — Encounter (HOSPITAL_COMMUNITY): Payer: Self-pay | Admitting: Physical Therapy

## 2018-06-17 DIAGNOSIS — R42 Dizziness and giddiness: Secondary | ICD-10-CM | POA: Diagnosis not present

## 2018-06-17 NOTE — Therapy (Signed)
[place], [place], [address] Phone: [phone]   Fax:  [phone]  Physical Therapy Evaluation  Patient Details  Name: Jonathan Burnett MRN: [id number] Date of Birth: September 28, 1950 Referring Provider (PT): Metta Clines    Encounter Date: 06/17/2018  PT End of Session - 06/17/18 1039    Visit Number  1    Number of Visits  8    Date for PT Re-Evaluation  07/17/18    Authorization Type  [employer] M[employer]    PT Start Time  0950    PT Stop Time  1035    PT Time Calculation (min)  45 min    Activity Tolerance  Patient tolerated treatment well    Behavior During Therapy  [facility] for tasks assessed/performed       Past Medical History:  Diagnosis Date  . Diverticulosis   . Hyperlipidemia   . Hypoglycemia     Past Surgical History:  Procedure Laterality Date  . APPENDECTOMY    . gallstones      There were no vitals filed for this visit.   Subjective Assessment - 06/17/18 0953    Subjective  [name] states that his balance is off; he does not have to do anything he can just be walking up the steps or sitting; typically it lasts for 2-3 minutes .  He has not noticed if it is worse if he turns his head one way or another.  He states that it has been going on for about 2-3 years now.  There is no nausea.     Pertinent History  DM,     Limitations  Standing;Walking;House hold activities    Currently in Pain?  No/denies         [facility] PT Assessment - 06/17/18 0001      Assessment   Medical Diagnosis  instability with gait     Referring Provider (PT)  Tomi Likens Adam     Onset Date/Surgical Date  06/17/16    Next MD Visit  08/12/2018    Prior Therapy  none      Precautions   Precautions  None      Restrictions   Weight Bearing Restrictions  No      Balance Screen   Has the patient fallen in the past 6 months  No    Has the patient had a decrease in activity level because of a fear of falling?   No    Is the patient  reluctant to leave their home because of a fear of falling?   No      Prior Function   Level of Independence  Independent    Vocation  Retired    Leisure  yard, garden       Cognition   Overall Cognitive Status  Within Functional Limits for tasks assessed      Observation/Other Assessments   Focus on Therapeutic Outcomes (FOTO)   82      Standardized Balance Assessment   Standardized Balance Assessment  Berg Balance Test;Dynamic Gait Index      Berg Balance Test   Sit to Stand  Able to stand without using hands and stabilize independently    Standing Unsupported  Able to stand safely 2 minutes    Sitting with Back Unsupported but Feet Supported on Floor or Stool  Able to sit safely and securely 2 minutes    Stand to Sit  Sits safely with minimal use of hands  Transfers  Able to transfer safely, minor use of hands    Standing Unsupported with Eyes Closed  Able to stand 10 seconds safely    Standing Unsupported with Feet Together  Able to place feet together independently and stand 1 minute safely    From Standing, Reach Forward with Outstretched Arm  Can reach forward >12 cm safely (5")    From Standing Position, Pick up Object from [facility] to pick up shoe safely and easily    From Standing Position, Turn to Look Behind Over each Shoulder  Looks behind from both sides and weight shifts well    Turn 360 Degrees  Able to turn 360 degrees safely in 4 seconds or less    Standing Unsupported, Alternately Place Feet on Step/Stool  Able to stand independently and safely and complete 8 steps in 20 seconds    Standing Unsupported, One Foot in Front  Needs help to step but can hold 15 seconds    Standing on One Leg  Able to lift leg independently and hold > 10 seconds    Total Score  52      Dynamic Gait Index   Level Surface  Normal    Change in Gait Speed  Normal    Gait with Horizontal Head Turns  Moderate Impairment    Gait with Vertical Head Turns  Moderate Impairment    Gait  and Pivot Turn  Moderate Impairment    Step Over Obstacle  Mild Impairment    Step Around Obstacles  Mild Impairment    Steps  Normal    Total Score  16           Vestibular Assessment - 06/17/18 0001      Symptom Behavior   Type of Dizziness   Imbalance;Unsteady with head/body turns    Frequency of Dizziness  5x a day     Duration of Dizziness  a minute or tw     Symptom Nature  Motion provoked    Aggravating Factors  Turning body quickly    Relieving Factors  Slow movements    Progression of Symptoms  No change since onset      Oculomotor Exam   Ocular ROM  normal     Head shaking Horizontal  Absent    Head Shaking Vertical  Absent    Smooth Pursuits  Saccades   right    Saccades  Intact      Positional Testing   Dix-Hallpike  Dix-Hallpike Right;Dix-Hallpike Left    Sidelying Test  Sidelying Right;Sidelying Left    Horizontal Canal Testing  Horizontal Canal Right;Horizontal Canal Left      Dix-Hallpike Right   Dix-Hallpike Right Symptoms  No nystagmus      Dix-Hallpike Left   Dix-Hallpike Left Symptoms  No nystagmus      Sidelying Right   Sidelying Right Symptoms  No nystagmus      Sidelying Left   Sidelying Left Symptoms  No nystagmus      Horizontal Canal Right   Horizontal Canal Right Symptoms  Normal      Horizontal Canal Left   Horizontal Canal Left Symptoms  Normal      Positional Sensitivities   Sit to Supine  No dizziness    Supine to Left Side  No dizziness    Supine to Right Side  No dizziness    Supine to Sitting  No dizziness    Right Hallpike  No dizziness    Up  from Right Hallpike  No dizziness    Up from Left Hallpike  No dizziness    Nose to Right Knee  No dizziness    Right Knee to Sitting  No dizziness    Nose to Left Knee  No dizziness    Rolling Right  No dizziness    Rolling Left  No dizziness          Objective measurements completed on examination: See above findings.          eye exercises x 5  Each  Balance  Exercises - 06/17/18 1037      Balance Exercises: Standing   Tandem Stance  Eyes open;2 reps    SLS  Eyes open;2 reps        PT Education - 06/17/18 1038    Education Details  HEP     Person(s) Educated  Patient    Methods  Explanation;Demonstration;Handout    Comprehension  Verbalized understanding;Returned demonstration       PT Short Term Goals - 06/17/18 1319      PT SHORT TERM GOAL #1   Title  Pt to be experiencing the sensation of dizziness 2 or less times a day to reduce his risk of falling.     Time  2    Period  Weeks    Status  New    Target Date  07/01/18        PT Long Term Goals - 06/17/18 1319      PT LONG TERM GOAL #1   Title  PT to only be having the sensation of dizziness 2 or less times a week to reduce the risk of falling     Time  4    Period  Weeks    Status  New    Target Date  07/15/18      PT LONG TERM GOAL #2   Title  PT to be I in an advanced HEP to allow pt to progress to having no sx of dizziness.     Time  4    Period  Weeks    Status  New    Target Date  07/15/18             Plan - 06/17/18 1040    Clinical Impression Statement  Mr. Stefanko is a 68 yo male who has been referred to skilled PT for BPPV.  He states that he becomes dizzy approximately 5 x a day but is unsure what brings it on, at time he is just sitting; typically the sensation lasts one to two minutes.  Evaluation demonstrated negative Dix-hallpike, (-) side-lying and (-)horizontal canal testing. Marland Kitchen  His main limitation  on the Berg balance was tandem stance while his main limitation on DGI was turning his head.  Mr. Conant will benefit from skilled PT for advanced balance exercies to decrease his risk of falling..     Personal Factors and Comorbidities  Past/Current Experience;Time since onset of injury/illness/exacerbation    Examination-Activity Limitations  Bend    Stability/Clinical Decision Making  Stable/Uncomplicated    Clinical Decision Making  Low    Rehab  Potential  Good    PT Frequency  2x / week    PT Duration  4 weeks    PT Treatment/Interventions  Therapeutic exercise;Balance training;Neuromuscular re-education;Therapeutic activities;Functional mobility training    PT Next Visit Plan  Begin tandem stance on foam with head turns, tandem gt on foam, retro gait on foam, side step on  foam, vector stances, progress to head turns as well as Warrior poses.    PT Home Exercise Plan  tandem stance,  eye motion exercises        Patient will benefit from skilled therapeutic intervention in order to improve the following deficits and impairments:  Dizziness  Visit Diagnosis: Dizziness and giddiness - Plan: PT plan of care cert/re-cert,     Problem List Patient Active Problem List   Diagnosis Date Noted  . HLD (hyperlipidemia) 09/03/2016  . Keratitis, herpetic 07/11/2014  . Herpes simplex iridocyclitis 08/12/2011  . Intermediate uveitis 02/11/2011  . GERD (gastroesophageal reflux disease) 11/24/2008    Rayetta Humphrey, PT CLT [phone] 06/17/2018, 1:28 PM  [facility] [address] [place], [place], [address] Phone: [phone]   Fax:  [phone]  Name: Jonathan Burnett MRN: [id number] Date of Birth: 02-21-1951

## 2018-06-17 NOTE — Patient Instructions (Addendum)
Movements: Eyes Only (Pictorial Reference)    Therapist: Use this card with Eye Exercises 14 through 17.   Copyright  VHI. All rights reserved.  Feet Heel-Toe "Tandem", Head Motion - Eyes Open    With eyes open, right foot directly in front of the other, move head slowly: up and down. Repeat __3__ times per session. Do __2__ sessions per day.  Copyright  VHI. All rights reserved.

## 2018-06-21 ENCOUNTER — Encounter (HOSPITAL_COMMUNITY): Payer: Self-pay | Admitting: Physical Therapy

## 2018-06-21 ENCOUNTER — Telehealth (HOSPITAL_COMMUNITY): Payer: Self-pay | Admitting: Family Medicine

## 2018-06-21 NOTE — Telephone Encounter (Signed)
06/21/18  Pt called and said to cx all his appts and to go ahead and discharge him.  He said he didn't know what to do about this account but just to cx he wouldn't be back.

## 2018-06-21 NOTE — Therapy (Signed)
[facility] [address] [place], [place], [address] Phone: [phone]   Fax:  [phone]  Patient Details  Name: Jonathan Burnett MRN: [id number] Date of Birth: 01/13/1951 Referring Provider: Jaffe  Encounter Date: 06/21/2018    PHYSICAL THERAPY DISCHARGE SUMMARY  Visits from Start of Care: 1  Current functional level related to goals / functional outcomes: Same    Remaining deficits: all   Education / Equipment: That most likely dizziness was not BPPV and that we would work with high balance and habituation  Plan: Patient agrees to discharge.  Patient goals were not met. Patient is being discharged due to the patient's request.  ?????       , PT CLT [phone] 06/21/2018, 12:02 PM  [facility] [address] [place], [place], [address] Phone: [phone]   Fax:  [phone] 

## 2018-06-22 ENCOUNTER — Ambulatory Visit (HOSPITAL_COMMUNITY): Payer: Medicare Other | Admitting: Physical Therapy

## 2018-06-24 ENCOUNTER — Ambulatory Visit (HOSPITAL_COMMUNITY): Payer: Medicare Other

## 2018-06-29 ENCOUNTER — Ambulatory Visit (HOSPITAL_COMMUNITY): Payer: Medicare Other | Admitting: Physical Therapy

## 2018-07-01 ENCOUNTER — Ambulatory Visit (HOSPITAL_COMMUNITY): Payer: Medicare Other | Admitting: Physical Therapy

## 2018-07-06 ENCOUNTER — Ambulatory Visit (HOSPITAL_COMMUNITY): Payer: Medicare Other

## 2018-07-07 ENCOUNTER — Telehealth: Payer: Medicare Other | Admitting: Family Medicine

## 2018-07-08 ENCOUNTER — Ambulatory Visit (HOSPITAL_COMMUNITY): Payer: Medicare Other

## 2018-07-13 ENCOUNTER — Ambulatory Visit (HOSPITAL_COMMUNITY): Payer: Medicare Other | Admitting: Physical Therapy

## 2018-07-15 ENCOUNTER — Encounter: Payer: Self-pay | Admitting: Family Medicine

## 2018-07-15 ENCOUNTER — Ambulatory Visit (INDEPENDENT_AMBULATORY_CARE_PROVIDER_SITE_OTHER): Payer: Medicare Other | Admitting: Family Medicine

## 2018-07-15 ENCOUNTER — Ambulatory Visit (HOSPITAL_COMMUNITY): Payer: Medicare Other | Admitting: Physical Therapy

## 2018-07-15 ENCOUNTER — Other Ambulatory Visit: Payer: Self-pay

## 2018-07-15 DIAGNOSIS — R52 Pain, unspecified: Secondary | ICD-10-CM

## 2018-07-15 DIAGNOSIS — R509 Fever, unspecified: Secondary | ICD-10-CM

## 2018-07-15 NOTE — Progress Notes (Signed)
Virtual Visit via telephone Note Due to COVID-19, visit is conducted virtually and was requested by patient.  I connected with Jonathan Burnett on 07/15/18 at 1020 by telephone and verified that I am speaking with the correct person using two identifiers. Jonathan Burnett is currently located at home and wife is currently with them during visit. The provider, Monia Pouch, FNP is located in their office at time of visit.  I discussed the limitations, risks, security and privacy concerns of performing an evaluation and management service by telephone and the availability of in person appointments. I also discussed with the patient that there may be a patient responsible charge related to this service. The patient expressed understanding and agreed to proceed.  Subjective:  Patient ID: Jonathan Burnett, male    DOB: 1950/10/11, 68 y.o.   MRN: [id number]  Chief Complaint:  No chief complaint on file.   HPI: Jonathan Burnett is a 68 y.o. male presenting on 07/15/2018 for No chief complaint on file.   Pt reports fatigue, myalgias, low grade fever, and decreased appetite. He states this started yesterday. States he does not feel like getting up out of the bed due to the fatigue / malaise. States he had one diarrhea episode last night. He is voiding. He states he does not have abdominal pain, nausea, or vomiting. He denies cough, sore throat, headache, rhinorrhea, shortness of breath, chest pain, or sneezing. He denies recent travel or known sick exposures. States he did go to the store to get some juice 2 days ago, denies other outings. States he has been working out in the yard a lot. He has been taking tylenol to manage his symptoms. He denies weakness or syncope. No dizziness or confusion.    Relevant past medical, surgical, family, and social history reviewed and updated as indicated.  Allergies and medications reviewed and updated.   Past Medical History:  Diagnosis Date  . Diverticulosis   .  Hyperlipidemia   . Hypoglycemia     Past Surgical History:  Procedure Laterality Date  . APPENDECTOMY    . gallstones      Social History   Socioeconomic History  . Marital status: Married    Spouse name: Inez Catalina  . Number of children: 2  . Years of education: 10  . Highest education level: 9th grade  Occupational History  . 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 Financial resource strain: Somewhat hard  . Food insecurity:    Worry: Never true    Inability: Never true  . Transportation needs:    Medical: No    Non-medical: No  Tobacco Use  . Smoking status: Never Smoker  . Smokeless tobacco: Never Used  Substance and Sexual Activity  . Alcohol use: No  . Drug use: No  . Sexual activity: Yes  Lifestyle  . Physical activity:    Days per week: 7 days    Minutes per session: 60 min  . Stress: Not at all  Relationships  . Social connections:    Talks on phone: Never    Gets together: Never    Attends religious service: Never    Active member of club or organization: No    Attends meetings of clubs or organizations: Never    Relationship status: Widowed  . Intimate partner violence:    Fear of current or ex partner: No    Emotionally abused: No    Physically abused: No  Forced sexual activity: No  Other Topics Concern  . Not on file  Social History Narrative   Patient is right-handed. He lives with his wife in a two level home, Restaurant manager, fast food on the first floor.    Outpatient Encounter Medications as of 07/15/2018  Medication Sig  . fluticasone (FLONASE) 50 MCG/ACT nasal spray Place 1 spray 2 (two) times daily as needed into both nostrils for allergies or rhinitis.   No facility-administered encounter medications on file as of 07/15/2018.     Allergies  Allergen Reactions  . Aspirin     REACTION: GI upset/nausea    Review of Systems  Constitutional: Positive for activity change, appetite change, chills, fatigue  and fever. Negative for diaphoresis and unexpected weight change.  HENT: Negative for congestion, rhinorrhea, sneezing and sore throat.   Respiratory: Negative for cough, shortness of breath and wheezing.   Cardiovascular: Negative for chest pain, palpitations and leg swelling.  Gastrointestinal: Positive for diarrhea. Negative for abdominal distention, abdominal pain, anal bleeding, blood in stool, constipation, nausea, rectal pain and vomiting.  Genitourinary: Negative for decreased urine volume and difficulty urinating.  Musculoskeletal: Positive for myalgias.  Neurological: Negative for dizziness, weakness, light-headedness and headaches.  Psychiatric/Behavioral: Negative for confusion.  All other systems reviewed and are negative.        Observations/Objective: No vital signs or physical exam, this was a telephone or virtual health encounter.  Pt alert and oriented, answers all questions appropriately, and able to speak in full sentences.    Assessment and Plan: Diagnoses and all orders for this visit:  Fever and chills Body aches Symptomatic care discussed. Adequate hydration and fever control discussed. Any worsening or new symptoms need further evaluation. Due to COVID-19, the pt was made aware to quarantine himself and to remain in quarantine for at least 3 days post symptom resolution. Pt aware of sings and symptoms that require emergent evaluation.     Follow Up Instructions: Return if symptoms worsen or fail to improve.    I discussed the assessment and treatment plan with the patient. The patient was provided an opportunity to ask questions and all were answered. The patient agreed with the plan and demonstrated an understanding of the instructions.   The patient was advised to call back or seek an in-person evaluation if the symptoms worsen or if the condition fails to improve as anticipated.  The above assessment and management plan was discussed with the patient.  The patient verbalized understanding of and has agreed to the management plan. Patient is aware to call the clinic if symptoms persist or worsen. Patient is aware when to return to the clinic for a follow-up visit. Patient educated on when it is appropriate to go to the emergency department.    I provided 15 minutes of non-face-to-face time during this encounter. The call started at 1020. The call ended at 1035.   Monia Pouch, FNP-C [facility] [address] [place], [place] [address] [phone]

## 2018-07-17 ENCOUNTER — Emergency Department (HOSPITAL_COMMUNITY): Payer: Medicare Other

## 2018-07-17 ENCOUNTER — Emergency Department (HOSPITAL_COMMUNITY)
Admission: EM | Admit: 2018-07-17 | Discharge: 2018-07-17 | Disposition: A | Discharge status: Home / Self Care | Payer: Medicare Other | Attending: Emergency Medicine | Admitting: Emergency Medicine

## 2018-07-17 ENCOUNTER — Encounter (HOSPITAL_COMMUNITY): Payer: Self-pay

## 2018-07-17 ENCOUNTER — Other Ambulatory Visit: Payer: Self-pay

## 2018-07-17 DIAGNOSIS — A09 Infectious gastroenteritis and colitis, unspecified: Secondary | ICD-10-CM | POA: Insufficient documentation

## 2018-07-17 DIAGNOSIS — R531 Weakness: Secondary | ICD-10-CM | POA: Diagnosis not present

## 2018-07-17 DIAGNOSIS — R509 Fever, unspecified: Secondary | ICD-10-CM | POA: Diagnosis present

## 2018-07-17 LAB — URINALYSIS, ROUTINE W REFLEX MICROSCOPIC
Bacteria, UA: NONE SEEN
Bilirubin Urine: NEGATIVE
Glucose, UA: NEGATIVE mg/dL
Ketones, ur: 5 mg/dL — AB
Leukocytes,Ua: NEGATIVE
Nitrite: NEGATIVE
Protein, ur: NEGATIVE mg/dL
Specific Gravity, Urine: 1.019 (ref 1.005–1.030)
pH: 5 (ref 5.0–8.0)

## 2018-07-17 LAB — BASIC METABOLIC PANEL
Anion gap: 8 (ref 5–15)
BUN: 15 mg/dL (ref 8–23)
CO2: 19 mmol/L — ABNORMAL LOW (ref 22–32)
Calcium: 8.5 mg/dL — ABNORMAL LOW (ref 8.9–10.3)
Chloride: 107 mmol/L (ref 98–111)
Creatinine, Ser: 1.07 mg/dL (ref 0.61–1.24)
GFR calc Af Amer: >60 mL/min (ref >60)
GFR calc non Af Amer: >60 mL/min (ref >60)
Glucose, Bld: 102 mg/dL — ABNORMAL HIGH (ref 70–99)
Potassium: 3.6 mmol/L (ref 3.5–5.1)
Sodium: 134 mmol/L — ABNORMAL LOW (ref 135–145)

## 2018-07-17 LAB — CBC WITH DIFFERENTIAL/PLATELET
Abs Immature Granulocytes: 0.06 10*3/uL (ref 0.00–0.07)
Basophils Absolute: 0.1 10*3/uL (ref 0.0–0.1)
Basophils Relative: 1 %
Eosinophils Absolute: 0 10*3/uL (ref 0.0–0.5)
Eosinophils Relative: 0 %
HCT: 43.2 % (ref 39.0–52.0)
Hemoglobin: 14.6 g/dL (ref 13.0–17.0)
Immature Granulocytes: 1 %
Lymphocytes Relative: 10 %
Lymphs Abs: 1 10*3/uL (ref 0.7–4.0)
MCH: 31 pg (ref 26.0–34.0)
MCHC: 33.8 g/dL (ref 30.0–36.0)
MCV: 91.7 fL (ref 80.0–100.0)
Monocytes Absolute: 0.5 10*3/uL (ref 0.1–1.0)
Monocytes Relative: 5 %
Neutro Abs: 8.8 10*3/uL — ABNORMAL HIGH (ref 1.7–7.7)
Neutrophils Relative %: 83 %
Platelets: 161 10*3/uL (ref 150–400)
RBC: 4.71 MIL/uL (ref 4.22–5.81)
RDW: 14.1 % (ref 11.5–15.5)
WBC: 10.5 10*3/uL (ref 4.0–10.5)
nRBC: 0 % (ref 0.0–0.2)

## 2018-07-17 MED ORDER — SODIUM CHLORIDE 0.9 % IV BOLUS
1000.0000 mL | Freq: Once | INTRAVENOUS | Status: AC
  Administered 2018-07-17: 06:00:00 1000 mL via INTRAVENOUS

## 2018-07-17 MED ORDER — ACETAMINOPHEN 325 MG PO TABS
650.0000 mg | ORAL_TABLET | Freq: Once | ORAL | Status: AC
  Administered 2018-07-17: 650 mg via ORAL
  Filled 2018-07-17: qty 2

## 2018-07-17 NOTE — ED Triage Notes (Signed)
Pt has had at temp of 99.9 at home for the past 3-4 days.  Pt also has complaints of headache, bilateral shoulder pain, and neck pain.  Denies any injuries or recent lifting.

## 2018-07-17 NOTE — ED Provider Notes (Signed)
[facility] DEPT Provider Note: Jonathan Spurling, MD, FACEP  CSN: [id number] MRN: [id number] ARRIVAL: 07/17/18 at 0524 ROOM: [place]  Fever   HISTORY OF PRESENT ILLNESS  07/17/18 5:59 AM Jonathan Burnett is a 68 y.o. male with a 3 to 4-day history of "feeling yucky".  By this he means general malaise, weakness, aching of his shoulder muscles, abdominal cramping and diarrhea.  The abdominal cramping precedes the diarrhea and is relieved by it.  His fever has been as high as 101.  He denies nausea, vomiting, cough, shortness of breath or nasal congestion.   Past Medical History:  Diagnosis Date  . Diverticulosis   . Hyperlipidemia   . Hypoglycemia     Past Surgical History:  Procedure Laterality Date  . APPENDECTOMY    . gallstones      Family History  Problem Relation Age of Onset  . Cancer Father        lung cancer  . Hypertension Father   . Cancer Brother        stomach  . Healthy Sister   . Healthy Daughter   . Healthy Brother   . Healthy Brother   . Healthy Brother   . Healthy Sister   . Healthy Daughter     Social History   Tobacco Use  . Smoking status: Never Smoker  . Smokeless tobacco: Never Used  Substance Use Topics  . Alcohol use: No  . Drug use: No    Prior to Admission medications   Medication Sig Start Date End Date Taking? Authorizing Provider  fluticasone (FLONASE) 50 MCG/ACT nasal spray Place 1 spray 2 (two) times daily as needed into both nostrils for allergies or rhinitis. 02/27/17   Dettinger, Fransisca Kaufmann, MD    Allergies Aspirin   REVIEW OF SYSTEMS  Negative except as noted here or in the History of Present Illness.   PHYSICAL EXAMINATION  Initial Vital Signs Blood pressure 118/83, pulse 94, temperature (!) 101 F (38.3 C), temperature source Rectal, resp. rate 19, height 5\' 11"  (1.803 m), weight 70.3 kg, SpO2 96 %.  Examination General: Well-developed, well-nourished male in no acute distress; appearance  consistent with age of record HENT: normocephalic; atraumatic Eyes: pupils equal, round and reactive to light; extraocular muscles intact Neck: supple Heart: regular rate and rhythm Lungs: clear to auscultation bilaterally Abdomen: soft; nondistended; nontender; no masses or hepatosplenomegaly; bowel sounds present Extremities: No deformity; full range of motion; pulses normal Neurologic: Awake, alert and oriented; motor function intact in all extremities and symmetric; no facial droop Skin: Warm and dry Psychiatric: Normal mood and affect   RESULTS  Summary of this visit's results, reviewed by myself:   EKG Interpretation  Date/Time:  Saturday July 17 2018 06:01:39 EDT Ventricular Rate:  90 PR Interval:    QRS Duration: 91 QT Interval:  336 QTC Calculation: 412 R Axis:   16 Text Interpretation:  Sinus rhythm Low voltage, extremity leads No significant change was found Confirmed by Jonathan Burnett, Jonathan Burnett [phone]) on 07/17/2018 6:12:21 AM      Laboratory Studies: Results for orders placed or performed during the hospital encounter of 07/17/18 (from the past 24 hour(s))  Urinalysis, Routine w reflex microscopic     Status: Abnormal   Collection Time: 07/17/18  5:44 AM  Result Value Ref Range   Color, Urine YELLOW YELLOW   APPearance CLEAR CLEAR   Specific Gravity, Urine 1.019 1.005 - 1.030   pH 5.0 5.0 - 8.0   Glucose,  UA NEGATIVE NEGATIVE mg/dL   Hgb urine dipstick MODERATE (A) NEGATIVE   Bilirubin Urine NEGATIVE NEGATIVE   Ketones, ur 5 (A) NEGATIVE mg/dL   Protein, ur NEGATIVE NEGATIVE mg/dL   Nitrite NEGATIVE NEGATIVE   Leukocytes,Ua NEGATIVE NEGATIVE   RBC / HPF 0-5 0 - 5 RBC/hpf   WBC, UA 0-5 0 - 5 WBC/hpf   Bacteria, UA NONE SEEN NONE SEEN   Squamous Epithelial / LPF 0-5 0 - 5   Mucus PRESENT   CBC with Differential/Platelet     Status: Abnormal   Collection Time: 07/17/18  6:09 AM  Result Value Ref Range   WBC 10.5 4.0 - 10.5 K/uL   RBC 4.71 4.22 - 5.81 MIL/uL    Hemoglobin 14.6 13.0 - 17.0 g/dL   HCT 43.2 39.0 - 52.0 %   MCV 91.7 80.0 - 100.0 fL   MCH 31.0 26.0 - 34.0 pg   MCHC 33.8 30.0 - 36.0 g/dL   RDW 14.1 11.5 - 15.5 %   Platelets 161 150 - 400 K/uL   nRBC 0.0 0.0 - 0.2 %   Neutrophils Relative % 83 %   Neutro Abs 8.8 (H) 1.7 - 7.7 K/uL   Lymphocytes Relative 10 %   Lymphs Abs 1.0 0.7 - 4.0 K/uL   Monocytes Relative 5 %   Monocytes Absolute 0.5 0.1 - 1.0 K/uL   Eosinophils Relative 0 %   Eosinophils Absolute 0.0 0.0 - 0.5 K/uL   Basophils Relative 1 %   Basophils Absolute 0.1 0.0 - 0.1 K/uL   Immature Granulocytes 1 %   Abs Immature Granulocytes 0.06 0.00 - 0.07 K/uL  Basic metabolic panel     Status: Abnormal   Collection Time: 07/17/18  6:09 AM  Result Value Ref Range   Sodium 134 (L) 135 - 145 mmol/L   Potassium 3.6 3.5 - 5.1 mmol/L   Chloride 107 98 - 111 mmol/L   CO2 19 (L) 22 - 32 mmol/L   Glucose, Bld 102 (H) 70 - 99 mg/dL   BUN 15 8 - 23 mg/dL   Creatinine, Ser 1.07 0.61 - 1.24 mg/dL   Calcium 8.5 (L) 8.9 - 10.3 mg/dL   GFR calc non Af Amer >60 >60 mL/min   GFR calc Af Amer >60 >60 mL/min   Anion gap 8 5 - 15   Imaging Studies: No results found.  ED COURSE and MDM  Nursing notes and initial vitals signs, including pulse oximetry, reviewed.  Vitals:   07/17/18 3295 07/17/18 0636 07/17/18 0638 07/17/18 0645  BP:  123/81  124/80  Pulse: 89 83 82 84  Resp: 18 (!) 24 18 (!) 23  Temp:    100.2 F (37.9 C)  TempSrc:    Rectal  SpO2: 96% 96% 96% 95%  Weight:      Height:       Jonathan Burnett was evaluated in Emergency Department on 07/17/2018 for the symptoms described in the history of present illness. He was evaluated in the context of the global COVID-19 pandemic, which necessitated consideration that the patient might be at risk for infection with the SARS-CoV-2 virus that causes COVID-19. Institutional protocols and algorithms that pertain to the evaluation of patients at risk for COVID-19 are in a state of rapid  change based on information released by regulatory bodies including the CDC and federal and state organizations. These policies and algorithms were followed during the patient's care in the ED.  7:23 AM Patient feeling better after IV  fluid bolus and Tylenol.  This likely represents of viral illness.  He was advised to stay in isolation as there is a small possibility this could represent COVID-19.  PROCEDURES    ED DIAGNOSES     ICD-10-CM   1. Diarrhea of infectious origin A09        Jonathan Burnett, Jonathan Reichmann, MD 07/17/18 [phone]

## 2018-07-22 LAB — CULTURE, BLOOD (ROUTINE X 2)
Culture: NO GROWTH
Culture: NO GROWTH
Special Requests: ADEQUATE
Special Requests: ADEQUATE

## 2018-08-13 ENCOUNTER — Encounter: Payer: Medicare Other | Admitting: *Deleted

## 2018-10-01 ENCOUNTER — Ambulatory Visit: Payer: Medicare Other | Admitting: Neurology

## 2018-10-21 ENCOUNTER — Ambulatory Visit (INDEPENDENT_AMBULATORY_CARE_PROVIDER_SITE_OTHER): Payer: Medicare Other | Admitting: *Deleted

## 2018-10-21 ENCOUNTER — Other Ambulatory Visit: Payer: Self-pay

## 2018-10-21 DIAGNOSIS — Z23 Encounter for immunization: Secondary | ICD-10-CM | POA: Diagnosis not present

## 2018-10-21 NOTE — Progress Notes (Signed)
Pt given Prevnar vaccine Tolerated well

## 2018-11-04 ENCOUNTER — Encounter: Payer: Self-pay | Admitting: Gastroenterology

## 2018-11-10 DIAGNOSIS — H2513 Age-related nuclear cataract, bilateral: Secondary | ICD-10-CM | POA: Diagnosis not present

## 2018-11-10 DIAGNOSIS — H16103 Unspecified superficial keratitis, bilateral: Secondary | ICD-10-CM | POA: Diagnosis not present

## 2018-11-29 ENCOUNTER — Ambulatory Visit (INDEPENDENT_AMBULATORY_CARE_PROVIDER_SITE_OTHER): Payer: Medicare Other | Admitting: *Deleted

## 2018-11-29 ENCOUNTER — Telehealth: Payer: Self-pay | Admitting: *Deleted

## 2018-11-29 ENCOUNTER — Other Ambulatory Visit: Payer: Self-pay | Admitting: *Deleted

## 2018-11-29 VITALS — Ht 71.0 in | Wt 155.0 lb

## 2018-11-29 DIAGNOSIS — Z0000 Encounter for general adult medical examination without abnormal findings: Secondary | ICD-10-CM

## 2018-11-29 NOTE — Progress Notes (Signed)
MEDICARE ANNUAL WELLNESS VISIT  11/29/2018  Telephone Visit Disclaimer This Medicare AWV was conducted by telephone due to national recommendations for restrictions regarding the COVID-19 Pandemic (e.g. social distancing).  I verified, using two identifiers, that I am speaking with Jonathan Burnett or their authorized healthcare agent. I discussed the limitations, risks, security, and privacy concerns of performing an evaluation and management service by telephone and the potential availability of an in-person appointment in the future. The patient expressed understanding and agreed to proceed.   Subjective:  Jonathan Burnett is a 68 y.o. male patient of Dettinger, Fransisca Kaufmann, MD who had a Medicare Annual Wellness Visit today via telephone. Jonathan Burnett is Retired and lives with their family. he has 4 children. he reports that he is socially active and does interact with friends/family regularly. he is not physically active and enjoys gardening.  Patient Care Team: Dettinger, Fransisca Kaufmann, MD as PCP - General (Family Medicine) Pieter Partridge, DO as Consulting Physician (Neurology)  Advanced Directives 11/29/2018 07/17/2018 06/17/2018 07/28/2017 08/31/2016  Does Patient Have a Medical Advance Directive? No No No No No  Would patient like information on creating a medical advance directive? Yes (MAU/Ambulatory/Procedural Areas - Information given) No - Guardian declined No - Patient declined No - Patient declined -    Hospital Utilization Over the Past 12 Months: # of hospitalizations or ER visits: 0 # of surgeries: 0  Review of Systems    Patient reports that his overall health is unchanged compared to last year.  Patient Reported Readings (BP, Pulse, CBG, Weight, etc) none  Review of Systems: History obtained from chart review and the patient General ROS: negative Ophthalmic ROS: positive for - uses glasses ENT ROS: positive for - hearing change and visual changes  All other systems negative.  Pain  Assessment Pain : No/denies pain     Current Medications & Allergies (verified) Allergies as of 11/29/2018      Reactions   Aspirin    REACTION: GI upset/nausea      Medication List       Accurate as of November 29, 2018  2:53 PM. If you have any questions, ask your nurse or doctor.        fluticasone 50 MCG/ACT nasal spray Commonly known as: FLONASE Place 1 spray 2 (two) times daily as needed into both nostrils for allergies or rhinitis.   Vascepa 1 g Caps Generic drug: Icosapent Ethyl       History (reviewed): Past Medical History:  Diagnosis Date  . Diverticulosis   . Hyperlipidemia   . Hypoglycemia    Past Surgical History:  Procedure Laterality Date  . APPENDECTOMY    . gallstones     Family History  Problem Relation Age of Onset  . Cancer Father        lung cancer  . Hypertension Father   . Cancer Brother        stomach  . Healthy Sister   . Healthy Daughter   . Healthy Brother   . Healthy Brother   . Healthy Brother   . Healthy Sister   . Healthy Daughter    Social History   Socioeconomic History  . Marital status: Married    Spouse name: Inez Catalina  . Number of children: 2  . Years of education: 10  . Highest education level: 9th grade  Occupational History  . 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 Financial  resource strain: Somewhat hard  . Food insecurity    Worry: Never true    Inability: Never true  . Transportation needs    Medical: No    Non-medical: No  Tobacco Use  . Smoking status: Never Smoker  . Smokeless tobacco: Never Used  Substance and Sexual Activity  . Alcohol use: No  . Drug use: No  . Sexual activity: Yes  Lifestyle  . Physical activity    Days per week: 7 days    Minutes per session: 60 min  . Stress: Not at all  Relationships  . Social Herbalist on phone: Never    Gets together: Never    Attends religious service: Never    Active member of club  or organization: No    Attends meetings of clubs or organizations: Never    Relationship status: Widowed  Other Topics Concern  . Not on file  Social History Narrative   Patient is right-handed. He lives with his wife in a two level home, Restaurant manager, fast food on the first floor.    Activities of Daily Living In your present state of health, do you have any difficulty performing the following activities: 11/29/2018  Hearing? N  Vision? Y  Difficulty concentrating or making decisions? Y  Walking or climbing stairs? N  Dressing or bathing? N  Doing errands, shopping? N  Preparing Food and eating ? N  Using the Toilet? N  In the past six months, have you accidently leaked urine? N  Do you have problems with loss of bowel control? N  Managing your Medications? N  Managing your Finances? N  Housekeeping or managing your Housekeeping? N  Some recent data might be hidden    Patient Education/ Literacy How often do you need to have someone help you when you read instructions, pamphlets, or other written materials from your doctor or pharmacy?: 1 - Never What is the last grade level you completed in school?: 10th Grade  Exercise Current Exercise Habits: The patient does not participate in regular exercise at present, Exercise limited by: None identified  Diet Patient reports consuming 3 meals a day and 2 snack(s) a day Patient reports that his primary diet is: Regular Patient reports that she does have regular access to food.   Depression Screen PHQ 2/9 Scores 11/29/2018 05/05/2018 11/19/2017 07/29/2017 07/28/2017 07/21/2017 05/21/2017  PHQ - 2 Score 0 0 0 0 0 0 0     Fall Risk Fall Risk  11/29/2018 05/28/2018 11/19/2017 07/29/2017 07/28/2017  Falls in the past year? 0 0 No No No  Number falls in past yr: 0 - - - -  Injury with Fall? 0 - - - -  Follow up - Falls evaluation completed - - -     Objective:  Jonathan Burnett seemed alert and oriented and he participated appropriately during our telephone visit.   Blood Pressure Weight BMI  BP Readings from Last 3 Encounters:  07/17/18 117/76  05/28/18 102/72  05/05/18 113/71   Wt Readings from Last 3 Encounters:  11/29/18 154 lb 15.7 oz (70.3 kg)  07/17/18 155 lb (70.3 kg)  05/28/18 155 lb (70.3 kg)   BMI Readings from Last 1 Encounters:  11/29/18 21.62 kg/m    *Unable to obtain current vital signs, weight, and BMI due to telephone visit type  Hearing/Vision  . Jonathan Burnett did  seem to have difficulty with hearing/understanding during the telephone conversation . Reports that he has not had a formal eye exam by  an eye care professional within the past year . Reports that he has not had a formal hearing evaluation within the past year *Unable to fully assess hearing and vision during telephone visit type  Cognitive Function: 6CIT Screen 11/29/2018  What Year? 0 points  What month? 0 points  What time? 0 points  Count back from 20 0 points  Months in reverse 0 points  Repeat phrase 0 points  Total Score 0   (Normal:0-7, Significant for Dysfunction: >8)  Normal Cognitive Function Screening: Yes   Immunization & Health Maintenance Record Immunization History  Administered Date(s) Administered  . Influenza, High Dose Seasonal PF 01/06/2017, 01/06/2018  . Pneumococcal Conjugate-13 10/21/2018  . Pneumococcal Polysaccharide-23 05/21/2017  . Tdap 01/06/2018    Health Maintenance  Topic Date Due  . Hepatitis C Screening  12/21/1950  . COLONOSCOPY  10/28/2018  . INFLUENZA VACCINE  11/13/2018  . TETANUS/TDAP  01/07/2028  . PNA vac Low Risk Adult  Completed       Assessment  This is a routine wellness examination for Jonathan Burnett.  Health Maintenance: Due or Overdue Health Maintenance Due  Topic Date Due  . Hepatitis C Screening  [birth year]  . COLONOSCOPY  10/28/2018  . INFLUENZA VACCINE  11/13/2018    Jonathan Burnett does not need a referral for Community Assistance: Care Management:   no Social Work:    no Prescription  Assistance:  no Nutrition/Diabetes Education:  no   Plan:  Personalized Goals Goals Addressed   None    Personalized Health Maintenance & Screening Recommendations  Influenza vaccine Colorectal cancer screening Advanced directives: has NO advanced directive  - add't info requested. Referral to SW: no Shingrix  Lung Cancer Screening Recommended: no (Low Dose CT Chest recommended if Age 41-80 years, 30 pack-year currently smoking OR have quit w/in past 15 years) Hepatitis C Screening recommended: yes HIV Screening recommended: no  Advanced Directives: Written information was prepared per patient's request.  Referrals & Orders No orders of the defined types were placed in this encounter.   Follow-up Plan . Follow-up with Dettinger, Fransisca Kaufmann, MD as planned    I have personally reviewed and noted the following in the patient's chart:   . Medical and social history . Use of alcohol, tobacco or illicit drugs  . Current medications and supplements . Functional ability and status . Nutritional status . Physical activity . Advanced directives . List of other physicians . Hospitalizations, surgeries, and ER visits in previous 12 months . Vitals . Screenings to include cognitive, depression, and falls . Referrals and appointments  In addition, I have reviewed and discussed with Jonathan Burnett certain preventive protocols, quality metrics, and best practice recommendations. A written personalized care plan for preventive services as well as general preventive health recommendations is available and can be mailed to the patient at his request.      Jonathan Heath, LPN  [id number]

## 2018-11-29 NOTE — Telephone Encounter (Signed)
2nd message left for patient to return call to reschedule Previsit.

## 2018-11-29 NOTE — Patient Instructions (Signed)
Jonathan Burnett , Thank you for taking time to come for your Medicare Wellness Visit. I appreciate your ongoing commitment to your health goals. Please review the following plan we discussed and let me know if I can assist you in the future.   These are the goals we discussed: Goals    . DIET - INCREASE WATER INTAKE    . Exercise 150 min/wk Moderate Activity       This is a list of the screening recommended for you and due dates:  Health Maintenance  Topic Date Due  .  Hepatitis C: One time screening is recommended by Center for Disease Control  (CDC) for  adults born from [birth year] through 1966.   [birth year]  . Colon Cancer Screening  10/28/2018  . Flu Shot  11/13/2018  . Tetanus Vaccine  01/07/2028  . Pneumonia vaccines  Completed     Advance Directive  Advance directives are legal documents that let you make choices ahead of time about your health care and medical treatment in case you become unable to communicate for yourself. Advance directives are a way for you to communicate your wishes to family, friends, and health care providers. This can help convey your decisions about end-of-life care if you become unable to communicate. Discussing and writing advance directives should happen over time rather than all at once. Advance directives can be changed depending on your situation and what you want, even after you have signed the advance directives. If you do not have an advance directive, some states assign family decision makers to act on your behalf based on how closely you are related to them. Each state has its own laws regarding advance directives. You may want to check with your health care provider, attorney, or state representative about the laws in your state. There are different types of advance directives, such as:  Medical power of attorney.  Living will.  Do not resuscitate (DNR) or do not attempt resuscitation (DNAR) order. Health care proxy and medical power of attorney A  health care proxy, also called a health care agent, is a person who is appointed to make medical decisions for you in cases in which you are unable to make the decisions yourself. Generally, people choose someone they know well and trust to represent their preferences. Make sure to ask this person for an agreement to act as your proxy. A proxy may have to exercise judgment in the event of a medical decision for which your wishes are not known. A medical power of attorney is a legal document that names your health care proxy. Depending on the laws in your state, after the document is written, it may also need to be:  Signed.  Notarized.  Dated.  Copied.  Witnessed.  Incorporated into your medical record. You may also want to appoint someone to manage your financial affairs in a situation in which you are unable to do so. This is called a durable power of attorney for finances. It is a separate legal document from the durable power of attorney for health care. You may choose the same person or someone different from your health care proxy to act as your agent in financial matters. If you do not appoint a proxy, or if there is a concern that the proxy is not acting in your best interests, a court-appointed guardian may be designated to act on your behalf. Living will A living will is a set of instructions documenting your wishes about medical care when  you cannot express them yourself. Health care providers should keep a copy of your living will in your medical record. You may want to give a copy to family members or friends. To alert caregivers in case of an emergency, you can place a card in your wallet to let them know that you have a living will and where they can find it. A living will is used if you become:  Terminally ill.  Incapacitated.  Unable to communicate or make decisions. Items to consider in your living will include:  The use or non-use of life-sustaining equipment, such as  dialysis machines and breathing machines (ventilators).  A DNR or DNAR order, which is the instruction not to use cardiopulmonary resuscitation (CPR) if breathing or heartbeat stops.  The use or non-use of tube feeding.  Withholding of food and fluids.  Comfort (palliative) care when the goal becomes comfort rather than a cure.  Organ and tissue donation. A living will does not give instructions for distributing your money and property if you should pass away. It is recommended that you seek the advice of a lawyer when writing a will. Decisions about taxes, beneficiaries, and asset distribution will be legally binding. This process can relieve your family and friends of any concerns surrounding disputes or questions that may come up about the distribution of your assets. DNR or DNAR A DNR or DNAR order is a request not to have CPR in the event that your heart stops beating or you stop breathing. If a DNR or DNAR order has not been made and shared, a health care provider will try to help any patient whose heart has stopped or who has stopped breathing. If you plan to have surgery, talk with your health care provider about how your DNR or DNAR order will be followed if problems occur. Summary  Advance directives are the legal documents that allow you to make choices ahead of time about your health care and medical treatment in case you become unable to communicate for yourself.  The process of discussing and writing advance directives should happen over time. You can change the advance directives, even after you have signed them.  Advance directives include DNR or DNAR orders, living wills, and designating an agent as your medical power of attorney. This information is not intended to replace advice given to you by your health care provider. Make sure you discuss any questions you have with your health care provider. Document Released: 07/08/2007 Document Revised: 05/05/2018 Document Reviewed:  02/18/2016 Elsevier Patient Education  2020 [employer].

## 2018-11-29 NOTE — Telephone Encounter (Signed)
Patient scheduled for In Person Previsit today at 10 am. Patient has not arrived. Called patient. Directed to voicemail. Message3 left for the patient to call back and reschedule the previsit. Patient aware of the need to hear back prior to 5 pm today to avoid cancellation of Colonoscopy on 12/10/2018.

## 2018-11-30 ENCOUNTER — Other Ambulatory Visit: Payer: Self-pay

## 2018-11-30 ENCOUNTER — Ambulatory Visit (AMBULATORY_SURGERY_CENTER): Payer: Medicare Other | Admitting: *Deleted

## 2018-11-30 VITALS — Temp 97.3°F | Ht 71.0 in | Wt 150.0 lb

## 2018-11-30 DIAGNOSIS — Z1211 Encounter for screening for malignant neoplasm of colon: Secondary | ICD-10-CM

## 2018-11-30 MED ORDER — PEG 3350-KCL-NA BICARB-NACL 420 G PO SOLR: 4000.0000 mL | Freq: Once | ORAL | 0 refills | Status: AC

## 2018-11-30 NOTE — Progress Notes (Signed)
No egg or soy allergy known to patient  No issues with past sedation with any surgeries  or procedures, no intubation problems  No diet pills per patient No home 02 use per patient  No blood thinners per patient  Pt denies issues with constipation  No A fib or A flutter  EMMI video to pt for watching at home today in [facility]

## 2018-12-06 ENCOUNTER — Encounter: Payer: Self-pay | Admitting: Gastroenterology

## 2018-12-09 ENCOUNTER — Telehealth: Payer: Self-pay

## 2018-12-09 NOTE — Telephone Encounter (Signed)
Covid-19 screening questions   Do you now or have you had a fever in the last 14 days?  Do you have any respiratory symptoms of shortness of breath or cough now or in the last 14 days?  Do you have any family members or close contacts with diagnosed or suspected Covid-19 in the past 14 days?  Have you been tested for Covid-19 and found to be positive?       

## 2018-12-10 ENCOUNTER — Encounter: Payer: Self-pay | Admitting: Gastroenterology

## 2018-12-10 ENCOUNTER — Telehealth: Payer: Self-pay

## 2018-12-10 ENCOUNTER — Ambulatory Visit (AMBULATORY_SURGERY_CENTER): Payer: Medicare Other | Admitting: Gastroenterology

## 2018-12-10 ENCOUNTER — Other Ambulatory Visit: Payer: Self-pay

## 2018-12-10 VITALS — BP 116/94 | HR 64 | Temp 99.5°F | Resp 17 | Ht 71.0 in | Wt 155.0 lb

## 2018-12-10 DIAGNOSIS — D122 Benign neoplasm of ascending colon: Secondary | ICD-10-CM | POA: Diagnosis not present

## 2018-12-10 DIAGNOSIS — Z1211 Encounter for screening for malignant neoplasm of colon: Secondary | ICD-10-CM

## 2018-12-10 DIAGNOSIS — D124 Benign neoplasm of descending colon: Secondary | ICD-10-CM | POA: Diagnosis not present

## 2018-12-10 DIAGNOSIS — D123 Benign neoplasm of transverse colon: Secondary | ICD-10-CM

## 2018-12-10 DIAGNOSIS — K5520 Angiodysplasia of colon without hemorrhage: Secondary | ICD-10-CM

## 2018-12-10 MED ORDER — SODIUM CHLORIDE 0.9 % IV SOLN: 500.0000 mL | Freq: Once | INTRAVENOUS | Status: DC

## 2018-12-10 NOTE — Telephone Encounter (Signed)
-----   Message from Irving Copas., MD sent at 12/10/2018  4:54 PM EDT ----- Chong Sicilian, I did his screening colonoscopy today and found a large a sending colon polyp that will require an EMR.  I spoke briefly with the patient and his wife but I would like to have an opportunity to talk with him further in clinic. Please set up a visit to discuss role of advanced polyp resection. We can go ahead and put him into our Q with a plan for colonoscopy in the next 6 to 8 weeks. Thank you. GM

## 2018-12-10 NOTE — Progress Notes (Signed)
Report to PACU, RN, vss, BBS= Clear.  

## 2018-12-10 NOTE — Progress Notes (Signed)
Called to room to assist during endoscopic procedure.  Patient ID and intended procedure confirmed with present staff. Received instructions for my participation in the procedure from the performing physician.  

## 2018-12-10 NOTE — Patient Instructions (Signed)
Take Fibercon daily, handout on polyps, diverticulosis given  YOU HAD AN ENDOSCOPIC PROCEDURE TODAY AT [facility]:   Refer to the procedure report that was given to you for any specific questions about what was found during the examination.  If the procedure report does not answer your questions, please call your gastroenterologist to clarify.  If you requested that your care partner not be given the details of your procedure findings, then the procedure report has been included in a sealed envelope for you to review at your convenience later.  YOU SHOULD EXPECT: Some feelings of bloating in the abdomen. Passage of more gas than usual.  Walking can help get rid of the air that was put into your GI tract during the procedure and reduce the bloating. If you had a lower endoscopy (such as a colonoscopy or flexible sigmoidoscopy) you may notice spotting of blood in your stool or on the toilet paper. If you underwent a bowel prep for your procedure, you may not have a normal bowel movement for a few days.  Please Note:  You might notice some irritation and congestion in your nose or some drainage.  This is from the oxygen used during your procedure.  There is no need for concern and it should clear up in a day or so.  SYMPTOMS TO REPORT IMMEDIATELY:   Following lower endoscopy (colonoscopy or flexible sigmoidoscopy):  Excessive amounts of blood in the stool  Significant tenderness or worsening of abdominal pains  Swelling of the abdomen that is new, acute  Fever of 100F or higher   For urgent or emergent issues, a gastroenterologist can be reached at any hour by calling [phone].   DIET:  We do recommend a small meal at first, but then you may proceed to your regular diet.  Drink plenty of fluids but you should avoid alcoholic beverages for 24 hours.  ACTIVITY:  You should plan to take it easy for the rest of today and you should NOT DRIVE or use heavy machinery until  tomorrow (because of the sedation medicines used during the test).    FOLLOW UP: Our staff will call the number listed on your records 48-72 hours following your procedure to check on you and address any questions or concerns that you may have regarding the information given to you following your procedure. If we do not reach you, we will leave a message.  We will attempt to reach you two times.  During this call, we will ask if you have developed any symptoms of COVID 19. If you develop any symptoms (ie: fever, flu-like symptoms, shortness of breath, cough etc.) before then, please call [phone].  If you test positive for Covid 19 in the 2 weeks post procedure, please call and report this information to [place].    If any biopsies were taken you will be contacted by phone or by letter within the next 1-3 weeks.  Please call us at [phone] if you have not heard about the biopsies in 3 weeks.    SIGNATURES/CONFIDENTIALITY: You and/or your care partner have signed paperwork which will be entered into your electronic medical record.  These signatures attest to the fact that that the information above on your After Visit Summary has been reviewed and is understood.  Full responsibility of the confidentiality of this discharge information lies with you and/or your care-partner.

## 2018-12-10 NOTE — Op Note (Signed)
[facility] Patient Name: Jonathan Burnett Procedure Date: 12/10/2018 1:55 PM MRN: [id number] Endoscopist: Justice Britain , MD Age: 68 Referring MD:  Date of Birth: 1951/03/06 Gender: Male Account #: [id number] Procedure:                Colonoscopy Indications:              Screening for colorectal malignant neoplasm Medicines:                Monitored Anesthesia Care Procedure:                Pre-Anesthesia Assessment:                           - Prior to the procedure, a History and Physical                            was performed, and patient medications and                            allergies were reviewed. The patient's tolerance of                            previous anesthesia was also reviewed. The risks                            and benefits of the procedure and the sedation                            options and risks were discussed with the patient.                            All questions were answered, and informed consent                            was obtained. Prior Anticoagulants: The patient has                            taken no previous anticoagulant or antiplatelet                            agents except for aspirin. ASA Grade Assessment: II                            - A patient with mild systemic disease. After                            reviewing the risks and benefits, the patient was                            deemed in satisfactory condition to undergo the                            procedure.  After obtaining informed consent, the colonoscope                            was passed under direct vision. Throughout the                            procedure, the patient's blood pressure, pulse, and                            oxygen saturations were monitored continuously. The                            Pediatric Colonoscope was introduced through the                            anus and advanced to the 5 cm into the ileum.  The                            colonoscopy was performed without difficulty. The                            patient tolerated the procedure. The quality of the                            bowel preparation was good. The terminal ileum,                            ileocecal valve, appendiceal orifice, and rectum                            were photographed. Scope In: 2:11:35 PM Scope Out: 2:36:55 PM Scope Withdrawal Time: 0 hours 18 minutes 14 seconds  Total Procedure Duration: 0 hours 25 minutes 20 seconds  Findings:                 The digital rectal exam findings include                            hemorrhoids. Pertinent negatives include no                            palpable rectal lesions.                           The terminal ileum and ileocecal valve appeared                            normal.                           A 30 mm polyp was found in the proximal ascending                            colon on the first haustral fold after the IC  Valve. The polyp was semi-sessile. Polypectomy was                            not attempted due to need for EMR and Advanced                            resection attempt.                           Four, 3 to 5 mm polyps were found in the descending                            colon (1), transverse colon (1), ascending colon                            (2). The polyps were sessile. These polyps were                            removed with a cold snare. Resection and retrieval                            were complete.                           Multiple small-mouthed diverticula were found in                            the recto-sigmoid colon and sigmoid colon.                           Normal mucosa was found in the entire colon                            otherwise.                           Non-bleeding non-thrombosed internal hemorrhoids                            were found during retroflexion, during perianal                             exam and during digital exam. The hemorrhoids were                            Grade II (internal hemorrhoids that prolapse but                            reduce spontaneously). Complications:            No immediate complications. Estimated Blood Loss:     Estimated blood loss was minimal. Impression:               - Hemorrhoids found on digital rectal exam.                           -  The examined portion of the ileum was normal.                           - One 30 mm polyp in the proximal ascending colon.                            Resection not attempted.                           - Four, 3 to 5 mm polyps in the descending colon,                            transverse colon, ascending colon, removed with a                            cold snare. Resected and retrieved.                           - Diverticulosis in the recto-sigmoid colon and in                            the sigmoid colon.                           - Normal mucosa in the entire examined colon                            otherwise.                           - Non-bleeding non-thrombosed internal hemorrhoids. Recommendation:           - The patient will be observed post-procedure,                            until all discharge criteria are met.                           - Discharge patient to home.                           - Patient has a contact number available for                            emergencies. The signs and symptoms of potential                            delayed complications were discussed with the                            patient. Return to normal activities tomorrow.                            Written discharge instructions were provided to the  patient.                           - High fiber diet.                           - Use FiberCon 1 tablet PO daily.                           - Continue present medications.                           - Await pathology  results.                           - Repeat colonoscopy after clinic visit to discuss                            EMR of Ascending Colon Polyp and if patient agrees                            to move forward with scheduling within the next 2-3                            months.                           - The findings and recommendations were discussed                            with the patient.                           - The findings and recommendations were discussed                            with the patient's family. Justice Britain, MD 12/10/2018 2:45:01 PM

## 2018-12-13 NOTE — Telephone Encounter (Signed)
appt made to see Dr Rush Landmark on 10-6 at 64 am.  Message left on pt machine with appt and letter mailed with appt info.

## 2018-12-14 ENCOUNTER — Telehealth: Payer: Self-pay

## 2018-12-14 ENCOUNTER — Telehealth: Payer: Self-pay | Admitting: *Deleted

## 2018-12-14 NOTE — Telephone Encounter (Signed)
Left message on follow up call. 

## 2018-12-14 NOTE — Telephone Encounter (Signed)
No answer for post procedure will call back later this afternoon.

## 2018-12-16 ENCOUNTER — Encounter: Payer: Self-pay | Admitting: Gastroenterology

## 2018-12-22 ENCOUNTER — Ambulatory Visit (INDEPENDENT_AMBULATORY_CARE_PROVIDER_SITE_OTHER): Payer: Medicare Other

## 2018-12-22 ENCOUNTER — Other Ambulatory Visit: Payer: Self-pay

## 2018-12-22 DIAGNOSIS — Z23 Encounter for immunization: Secondary | ICD-10-CM

## 2018-12-28 ENCOUNTER — Other Ambulatory Visit: Payer: Self-pay | Admitting: Family Medicine

## 2019-01-18 ENCOUNTER — Encounter: Payer: Self-pay | Admitting: Gastroenterology

## 2019-01-18 ENCOUNTER — Ambulatory Visit: Payer: Medicare Other | Admitting: Gastroenterology

## 2019-01-18 ENCOUNTER — Other Ambulatory Visit: Payer: Self-pay

## 2019-01-18 ENCOUNTER — Other Ambulatory Visit (INDEPENDENT_AMBULATORY_CARE_PROVIDER_SITE_OTHER): Payer: Medicare Other

## 2019-01-18 VITALS — BP 100/60 | HR 80 | Temp 97.8°F | Ht 68.5 in | Wt 148.8 lb

## 2019-01-18 DIAGNOSIS — Z86010 Personal history of colonic polyps: Secondary | ICD-10-CM | POA: Diagnosis not present

## 2019-01-18 DIAGNOSIS — R933 Abnormal findings on diagnostic imaging of other parts of digestive tract: Secondary | ICD-10-CM

## 2019-01-18 DIAGNOSIS — D126 Benign neoplasm of colon, unspecified: Secondary | ICD-10-CM

## 2019-01-18 LAB — PROTIME-INR
INR: 1.2 ratio — ABNORMAL HIGH (ref 0.8–1.0)
Prothrombin Time: 13.6 s — ABNORMAL HIGH (ref 9.6–13.1)

## 2019-01-18 LAB — BASIC METABOLIC PANEL
BUN: 23 mg/dL (ref 6–23)
CO2: 26 mEq/L (ref 19–32)
Calcium: 9.9 mg/dL (ref 8.4–10.5)
Chloride: 105 mEq/L (ref 96–112)
Creatinine, Ser: 1.12 mg/dL (ref 0.40–1.50)
GFR: 64.89 mL/min (ref >60.00)
Glucose, Bld: 75 mg/dL (ref 70–99)
Potassium: 4 mEq/L (ref 3.5–5.1)
Sodium: 138 mEq/L (ref 135–145)

## 2019-01-18 LAB — CBC
HCT: 43.1 % (ref 39.0–52.0)
Hemoglobin: 14.5 g/dL (ref 13.0–17.0)
MCHC: 33.6 g/dL (ref 30.0–36.0)
MCV: 92.8 fl (ref 78.0–100.0)
Platelets: 202 10*3/uL (ref 150.0–400.0)
RBC: 4.64 Mil/uL (ref 4.22–5.81)
RDW: 14.6 % (ref 11.5–15.5)
WBC: 8.9 10*3/uL (ref 4.0–10.5)

## 2019-01-18 MED ORDER — PEG 3350-KCL-NA BICARB-NACL 420 G PO SOLR: 4000.0000 mL | Freq: Once | ORAL | 0 refills | Status: AC

## 2019-01-18 NOTE — Patient Instructions (Signed)
You have been scheduled for a colonoscopy. Please follow written instructions given to you at your visit today.  Please pick up your prep supplies at the pharmacy within the next 1-3 days. If you use inhalers (even only as needed), please bring them with you on the day of your procedure.  Due to recent COVID-19 restrictions implemented by our local and state authorities and in an effort to keep both patients and staff as safe as possible, our hospital system now requires COVID-19 testing prior to any scheduled hospital procedure. Please go to our [facility] location drive thru testing site ([address], [place], [place] [address]) on 02/24/19 at  9:00am. There will be multiple testing areas, the first checkpoint being for pre-procedure/surgery testing. Get into the right (yellow) lane that leads to the PAT testing team. You will not be billed at the time of testing but may receive a bill later depending on your insurance. The approximate cost of the test is $100. You must agree to quarantine from the time of your testing until the procedure date on 02/28/19 . This should include staying at home with ONLY the people you live with. Avoid take-out, grocery store shopping or leaving the house for any non-emergent reason. Failure to have your COVID-19 test done on the date and time you have been scheduled will result in cancellation of procedure. Please call our office at [phone] if you have any questions.   Your provider has requested that you go to the basement level for lab work before leaving today. Press "B" on the elevator. The lab is located at the first door on the left as you exit the elevator.  Thank you for choosing me and [facility].  Dr. Rush Landmark

## 2019-01-18 NOTE — Progress Notes (Signed)
[place] VISIT   Primary Care Provider Dettinger, Fransisca Kaufmann, MD [place] [address] [phone]  Patient Profile: Jonathan Burnett is a 68 y.o. male with a pmh significant for hyperlipidemia, diverticulosis, intermittent GERD, colon polyps (tubular adenomas), large a sending colon polyp.  The patient presents to the [facility] for an evaluation and management of problem(s) noted below:  Problem List 1. History of colonic polyps   2. Tubular adenoma of colon   3. Abnormal colonoscopy     History of Present Illness The patient presents today for follow-up after recent office colonoscopy with finding of a large ascending colon polyp.  This polyp was not resected due to need for potential EMR.  He was found to have other colon polyps that returned as adenomatous.  The patient has had no other significant GI symptoms.  His previous GERD symptoms are no longer present and he has no dysphagia or odynophagia.  GI Review of Systems Positive as above Negative for pain, nausea, vomiting, change in appetite, early satiety, change in bowel habits, melena, hematochezia  Review of Systems General: Denies fevers/chills/weight loss HEENT: Denies oral lesions Cardiovascular: Denies chest pain Pulmonary: Denies shortness of breath Gastroenterological: See HPI Genitourinary: Denies darkened urine Hematological: Denies easy bruising/bleeding Dermatological: Denies jaundice Psychological: Mood is stable   Medications Current Outpatient Medications  Medication Sig Dispense Refill   fluticasone (FLONASE) 50 MCG/ACT nasal spray Place 1 spray 2 (two) times daily as needed into both nostrils for allergies or rhinitis. 16 g 6   VASCEPA 1 g CAPS TAKE  (1)  CAPSULE  TWICE DAILY. 60 capsule 0   No current facility-administered medications for this visit.     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[employer]    Comment: Retired  Scientist, product/process development strain: Somewhat hard   Food insecurity    Worry: Never true    Inability: Never true   Transportation needs    Medical: No    Non-medical: No  Tobacco Use   Smoking status: Never Smoker   Smokeless tobacco: Never Used  Substance and Sexual Activity   Alcohol use: No   Drug use: No   Sexual activity: Yes  Lifestyle   Physical activity    Days per week: 7 days    Minutes per session: 60 min   Stress: Not at all  Relationships   Social connections    Talks on phone: Never    Gets together: Never    Attends religious service: Never    Active member of club or organization: No    Attends meetings of clubs or organizations: Never    Relationship status: Widowed   Intimate partner violence    Fear of current or ex partner: No  Emotionally abused: No    Physically abused: No    Forced sexual activity: No  Other Topics Concern   Not on file  Social History Narrative   Patient is right-handed. He lives with his wife in a two level home, Restaurant manager, fast food on the first floor.   Family History  Problem Relation Age of Onset   Hypertension Father    Lung cancer Father    Stomach cancer Brother    Healthy Sister    Healthy Daughter    Healthy Brother    Healthy Brother    Healthy Brother    Healthy Sister    Healthy Daughter    Colon  polyps Neg Hx    Colon cancer Neg Hx    Esophageal cancer Neg Hx    Rectal cancer Neg Hx    Inflammatory bowel disease Neg Hx    Liver disease Neg Hx    Pancreatic cancer Neg Hx    I have reviewed his medical, social, and family history in detail and updated the electronic medical record as necessary.    PHYSICAL EXAMINATION  BP 100/60    Pulse 80    Temp 97.8 F (36.6 C)    Ht 5' 8.5" (1.74 m)    Wt 148 lb 12.8 oz (67.5 kg)    BMI 22.30 kg/m  Wt Readings from Last 3 Encounters:  01/18/19 148 lb 12.8 oz (67.5 kg)  12/10/18 155 lb (70.3 kg)  11/30/18 150 lb (68 kg)  GEN: NAD, appears stated age, doesn't appear chronically ill, accompanied by wife PSYCH: Cooperative, without pressured speech EYE: Conjunctivae pink, sclerae anicteric ENT: MMM, without oral ulcers, no erythema or exudates noted NECK: Supple CV: RR without R/Gs  RESP: CTAB posteriorly, without wheezing GI: NABS, soft, NT/ND, without rebound or guarding, no HSM appreciated MSK/EXT: No lower extremity edema SKIN: No jaundice NEURO:  Alert & Oriented x 3, no focal deficits   REVIEW OF DATA  I reviewed the following data at the time of this encounter:  GI Procedures and Studies  August 2020 colonoscopy - Hemorrhoids found on digital rectal exam. - The examined portion of the ileum was normal. - One 30 mm polyp in the proximal ascending colon. Resection not attempted. - Four, 3 to 5 mm polyps in the descending colon, transverse colon, ascending colon, removed with a cold snare. Resected and retrieved. - Diverticulosis in the recto-sigmoid colon and in the sigmoid colon. - Normal mucosa in the entire examined colon otherwise. - Non-bleeding non-thrombosed internal hemorrhoids.  Laboratory Studies  Reviewed those in epic  Imaging Studies  No relevant imaging studies to review   ASSESSMENT  Mr. Heiges is a 68 y.o. male with a pmh significant for hyperlipidemia, diverticulosis, intermittent GERD, colon  polyps (tubular adenomas), large a sending colon polyp.  The patient is seen today for evaluation and management of:  1. History of colonic polyps   2. Tubular adenoma of colon   3. Abnormal colonoscopy    The patient is hemodynamically stable and clinically stable.  Based upon my recent colonoscopy, I do feel that it is reasonable to pursue an Advanced Polypectomy attempt of the polyp/lesion.  We discussed some of the techniques of advanced polypectomy which include Endoscopic Mucosal Resection, OVESCO Full-Thickness Resection, Endorotor Morcellation, and Tissue Ablation via Fulguration.  The risks and benefits of endoscopic evaluation were discussed with the patient; these include but are not limited to the risk of perforation, infection, bleeding, missed lesions, lack of diagnosis, severe illness  requiring hospitalization, as well as anesthesia and sedation related illnesses.  During attempts at advanced polypectomy, the risks of bleeding and perforation/leak are increased as opposed to diagnostic and screening colonoscopies, and that was discussed with the patient as well.   In addition, I explained that with the possible need for piecemeal resection, subsequent short-interval endoscopic evaluation for follow up and potential retreatment of the lesion/area may be necessary.  I did offer, a referral to surgery in order for patient to have opportunity to discuss surgical management/intervention prior to finalizing decision for attempt at endoscopic removal, however, the patient deferred on this.  If, after attempt at removal of the polyp, it is found that the patient has a complication or that an invasive lesion or malignant lesion is found, or that the polyp continues to recur, the patient is aware and understands that surgery may still be indicated/required.  All patient questions were answered, to the best of my ability, and the patient agrees to the aforementioned plan of action with follow-up as  indicated.   PLAN  Proceed with scheduling colonoscopy with EMR attempt Laboratories as outlined below within 4 to 8 weeks   Orders Placed This Encounter  Procedures   Procedural/ Surgical Case Request: ENDOSCOPIC MUCOSAL RESECTION +COLONOSCOPY   CBC   Basic Metabolic Panel (BMET)   INR/PT   Ambulatory referral to Gastroenterology    New Prescriptions   No medications on file   Modified Medications   No medications on file    Planned Follow Up No follow-ups on file.   Justice Britain, MD [facility] Advanced Endoscopy Office # [id number]

## 2019-01-19 ENCOUNTER — Encounter: Payer: Self-pay | Admitting: Gastroenterology

## 2019-02-15 ENCOUNTER — Other Ambulatory Visit: Payer: Self-pay | Admitting: Family Medicine

## 2019-02-15 NOTE — Telephone Encounter (Signed)
Dettinger. NTBS 30 days given 12/28/18

## 2019-02-15 NOTE — Telephone Encounter (Signed)
Apt scheduled.  

## 2019-02-17 ENCOUNTER — Ambulatory Visit (INDEPENDENT_AMBULATORY_CARE_PROVIDER_SITE_OTHER): Payer: Medicare Other | Admitting: Family Medicine

## 2019-02-17 ENCOUNTER — Encounter: Payer: Self-pay | Admitting: Family Medicine

## 2019-02-17 DIAGNOSIS — E782 Mixed hyperlipidemia: Secondary | ICD-10-CM

## 2019-02-17 MED ORDER — VASCEPA 1 G PO CAPS: 1.0000 | ORAL_CAPSULE | Freq: Two times a day (BID) | ORAL | 3 refills | Status: DC

## 2019-02-17 NOTE — Progress Notes (Signed)
   Virtual Visit via telephone Note  I connected with Jonathan Burnett on 02/17/19 at 0925 by telephone and verified that I am speaking with the correct person using two identifiers. Jonathan Burnett is currently located at home and no other people are currently with her during visit. The provider, Fransisca Kaufmann Jenasia Dolinar, MD is located in their office at time of visit.  Call ended at 0935  I discussed the limitations, risks, security and privacy concerns of performing an evaluation and management service by telephone and the availability of in person appointments. I also discussed with the patient that there may be a patient responsible charge related to this service. The patient expressed understanding and agreed to proceed.   History and Present Illness: Hyperlipidemia Patient is coming in for recheck of his hyperlipidemia. The patient is currently taking vascepa. They deny any issues with myalgias or history of liver damage from it. They deny any focal numbness or weakness or chest pain.   No diagnosis found.  Outpatient Encounter Medications as of 02/17/2019  Medication Sig  . fluticasone (FLONASE) 50 MCG/ACT nasal spray Place 1 spray 2 (two) times daily as needed into both nostrils for allergies or rhinitis.  Marland Kitchen VASCEPA 1 g CAPS TAKE  (1)  CAPSULE  TWICE DAILY.   No facility-administered encounter medications on file as of 02/17/2019.     Review of Systems  Constitutional: Negative for chills and fever.  Respiratory: Negative for shortness of breath and wheezing.   Cardiovascular: Negative for chest pain and leg swelling.  Musculoskeletal: Negative for back pain and gait problem.  Skin: Negative for rash.  Neurological: Negative for dizziness, weakness, light-headedness and numbness.  All other systems reviewed and are negative.   Observations/Objective: Patient sounds comfortable and in no acute distress  Assessment and Plan: Problem List Items Addressed This Visit      Other   HLD  (hyperlipidemia) - Primary   Relevant Medications   Icosapent Ethyl (VASCEPA) 1 g CAPS       Follow Up Instructions:  Follow up in 1 year.   I discussed the assessment and treatment plan with the patient. The patient was provided an opportunity to ask questions and all were answered. The patient agreed with the plan and demonstrated an understanding of the instructions.   The patient was advised to call back or seek an in-person evaluation if the symptoms worsen or if the condition fails to improve as anticipated.  The above assessment and management plan was discussed with the patient. The patient verbalized understanding of and has agreed to the management plan. Patient is aware to call the clinic if symptoms persist or worsen. Patient is aware when to return to the clinic for a follow-up visit. Patient educated on when it is appropriate to go to the emergency department.    I provided 10 minutes of non-face-to-face time during this encounter.    Worthy Rancher, MD

## 2019-02-24 ENCOUNTER — Other Ambulatory Visit (HOSPITAL_COMMUNITY)
Admission: RE | Admit: 2019-02-24 | Discharge: 2019-02-24 | Disposition: A | Discharge status: Home / Self Care | Payer: Medicare Other | Source: Ambulatory Visit | Attending: Gastroenterology | Admitting: Gastroenterology

## 2019-02-24 DIAGNOSIS — Z20828 Contact with and (suspected) exposure to other viral communicable diseases: Secondary | ICD-10-CM | POA: Diagnosis not present

## 2019-02-24 DIAGNOSIS — Z01812 Encounter for preprocedural laboratory examination: Secondary | ICD-10-CM | POA: Insufficient documentation

## 2019-02-25 NOTE — Progress Notes (Addendum)
Tried calling patient, but phone went straight to voicemail.  Left voicemail giving pt instructions for day of procedure.  Instructed pt to take eye drops if needed.  Instructed patient to be here at [facility] on Monday, Nov. 16th.  Do not eat or drink after midnight.

## 2019-02-26 LAB — NOVEL CORONAVIRUS, NAA (HOSP ORDER, SEND-OUT TO REF LAB; TAT 18-24 HRS): SARS-CoV-2, NAA: NOT DETECTED

## 2019-02-28 ENCOUNTER — Telehealth: Payer: Self-pay | Admitting: Gastroenterology

## 2019-02-28 ENCOUNTER — Ambulatory Visit (HOSPITAL_COMMUNITY)
Admission: RE | Admit: 2019-02-28 | Discharge: 2019-02-28 | Disposition: A | Discharge status: Home / Self Care | Payer: Medicare Other | Attending: Gastroenterology | Admitting: Gastroenterology

## 2019-02-28 ENCOUNTER — Other Ambulatory Visit: Payer: Self-pay

## 2019-02-28 ENCOUNTER — Encounter (HOSPITAL_COMMUNITY): Payer: Self-pay | Admitting: *Deleted

## 2019-02-28 ENCOUNTER — Encounter (HOSPITAL_COMMUNITY)
Admission: RE | Disposition: A | Discharge status: Home / Self Care | Payer: Self-pay | Source: Home / Self Care | Attending: Gastroenterology

## 2019-02-28 ENCOUNTER — Ambulatory Visit (HOSPITAL_COMMUNITY): Payer: Medicare Other | Admitting: Anesthesiology

## 2019-02-28 DIAGNOSIS — D126 Benign neoplasm of colon, unspecified: Secondary | ICD-10-CM

## 2019-02-28 DIAGNOSIS — K635 Polyp of colon: Secondary | ICD-10-CM

## 2019-02-28 DIAGNOSIS — K5520 Angiodysplasia of colon without hemorrhage: Secondary | ICD-10-CM | POA: Diagnosis not present

## 2019-02-28 DIAGNOSIS — R933 Abnormal findings on diagnostic imaging of other parts of digestive tract: Secondary | ICD-10-CM

## 2019-02-28 DIAGNOSIS — K641 Second degree hemorrhoids: Secondary | ICD-10-CM | POA: Insufficient documentation

## 2019-02-28 DIAGNOSIS — K5730 Diverticulosis of large intestine without perforation or abscess without bleeding: Secondary | ICD-10-CM | POA: Insufficient documentation

## 2019-02-28 DIAGNOSIS — Z86010 Personal history of colonic polyps: Secondary | ICD-10-CM

## 2019-02-28 DIAGNOSIS — D122 Benign neoplasm of ascending colon: Secondary | ICD-10-CM | POA: Insufficient documentation

## 2019-02-28 DIAGNOSIS — R109 Unspecified abdominal pain: Secondary | ICD-10-CM

## 2019-02-28 HISTORY — PX: COLONOSCOPY WITH PROPOFOL: SHX5780

## 2019-02-28 HISTORY — PX: HEMOSTASIS CLIP PLACEMENT: SHX6857

## 2019-02-28 HISTORY — PX: ENDOSCOPIC MUCOSAL RESECTION: SHX6839

## 2019-02-28 HISTORY — PX: SUBMUCOSAL LIFTING INJECTION: SHX6855

## 2019-02-28 SURGERY — COLONOSCOPY WITH PROPOFOL
Anesthesia: Monitor Anesthesia Care

## 2019-02-28 MED ORDER — ONDANSETRON HCL 4 MG/2ML IJ SOLN: 4.0000 mg | Freq: Four times a day (QID) | INTRAMUSCULAR | Status: DC | PRN

## 2019-02-28 MED ORDER — SODIUM CHLORIDE 0.9 % IV SOLN: INTRAVENOUS | Status: DC

## 2019-02-28 MED ORDER — OXYCODONE HCL 5 MG PO TABS: 5.0000 mg | ORAL_TABLET | Freq: Once | ORAL | Status: DC | PRN

## 2019-02-28 MED ORDER — PHENYLEPHRINE 40 MCG/ML (10ML) SYRINGE FOR IV PUSH (FOR BLOOD PRESSURE SUPPORT)
PREFILLED_SYRINGE | INTRAVENOUS | Status: DC | PRN
  Administered 2019-02-28 (×2): 80 ug via INTRAVENOUS
  Administered 2019-02-28: 40 ug via INTRAVENOUS

## 2019-02-28 MED ORDER — LACTATED RINGERS IV SOLN
INTRAVENOUS | Status: DC
  Administered 2019-02-28 (×2): via INTRAVENOUS

## 2019-02-28 MED ORDER — METRONIDAZOLE 500 MG PO TABS: 500.0000 mg | ORAL_TABLET | Freq: Two times a day (BID) | ORAL | 0 refills | Status: DC

## 2019-02-28 MED ORDER — OXYCODONE HCL 5 MG/5ML PO SOLN: 5.0000 mg | Freq: Once | ORAL | Status: DC | PRN

## 2019-02-28 MED ORDER — CIPROFLOXACIN HCL 500 MG PO TABS: 500.0000 mg | ORAL_TABLET | Freq: Two times a day (BID) | ORAL | 0 refills | Status: DC

## 2019-02-28 MED ORDER — PROPOFOL 10 MG/ML IV BOLUS: INTRAVENOUS | Status: DC | PRN

## 2019-02-28 MED ORDER — PROPOFOL 500 MG/50ML IV EMUL
INTRAVENOUS | Status: DC | PRN
  Administered 2019-02-28: 120 ug/kg/min via INTRAVENOUS

## 2019-02-28 SURGICAL SUPPLY — 22 items

## 2019-02-28 NOTE — Telephone Encounter (Signed)
Called and spoke with patient. He had done well and eaten breakfast before getting back home and then had discomfort occur in the RLQ. He can press of the rest of the abdomen and does not have any discomfort and it does not cause him to jump or have more progressive pain. He has hunger and wants to eat and is waiting for lunch. The patient has had a bowel movement and passed gas (was liquid no solids). He has no nausea or vomiting or fevers. 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 With this being said, I think the patient can take [phone] mg of Tylenol Q8H for the next day into tomorrow. I will send a Rx for Ciprofloxacin 500 mg BID and Flagyl 500 mg BID x 5-days if this is post-polypectomy syndrome. He should come in tomorrow for a KUB if issues still persist and he is somewhat better. If completely better then does not need to come in. I spoke with him candidly and told him if things progress or he cannot get settled and rest this evening due to the pain, then he will need to call our Oncall provider and let he/she know and the patient will then be directed to the hospital for further workup/evaluation. I think this will be less likely but needs to be done. He was appreciative for the callback.  Justice Britain, MD [facility] Advanced Endoscopy Office # [id number]

## 2019-02-28 NOTE — Telephone Encounter (Signed)
I tried to call him 3 times now at both numbers and cannot reach him. I will try and call again. Would try to use Simethicone. OK for Tylenol. I'll attempt callback within next 30 minutes. If you get a hold of him or they call back, get another number to try and reach them and I'll call. Thanks. GM

## 2019-02-28 NOTE — Transfer of Care (Signed)
Immediate Anesthesia Transfer of Care Note  Patient: Jonathan Burnett  Procedure(s) Performed: COLONOSCOPY WITH PROPOFOL (N/A ) ENDOSCOPIC MUCOSAL RESECTION (N/A ) SUBMUCOSAL LIFTING INJECTION HEMOSTASIS CLIP PLACEMENT FOREIGN BODY REMOVAL  Patient Location: PACU  Anesthesia Type:MAC  Level of Consciousness: drowsy  Airway & Oxygen Therapy: Patient Spontanous Breathing and Patient connected to face mask oxygen  Post-op Assessment: Report given to RN and Post -op Vital signs reviewed and stable  Post vital signs: Reviewed and stable  Last Vitals:  Vitals Value Taken Time  BP 94/70 02/28/19 0840  Temp    Pulse 62 02/28/19 0841  Resp 19 02/28/19 0841  SpO2 98 % 02/28/19 0841  Vitals shown include unvalidated device data.  Last Pain:  Vitals:   02/28/19 0655  TempSrc: Temporal  PainSc: 0-No pain         Complications: No apparent anesthesia complications

## 2019-02-28 NOTE — Telephone Encounter (Signed)
Lower Right side cramping after colonoscopy today.  No fever, nausea, no SOB.  NO rectal bleeding.  He did have one watery bowel movement after he returned home.  Please advise

## 2019-02-28 NOTE — H&P (Signed)
GASTROENTEROLOGY PROCEDURE H&P NOTE   Primary Care Physician: Dettinger, Fransisca Kaufmann, MD  HPI: Jonathan Burnett is a 68 y.o. male who presents for Colonoscopy with EMR attempt.  Past Medical History:  Diagnosis Date  . Colon polyp   . Diverticulosis   . GERD (gastroesophageal reflux disease)    occasionally   . Hyperlipidemia   . Hypoglycemia    Past Surgical History:  Procedure Laterality Date  . APPENDECTOMY    . CHOLECYSTECTOMY    . COLONOSCOPY     Current Facility-Administered Medications  Medication Dose Route Frequency Provider Last Rate Last Dose  . lactated ringers infusion   Intravenous Continuous Mansouraty, Telford Nab., MD 10 mL/hr at 02/28/19 0701     Allergies  Allergen Reactions  . Aspirin     REACTION: GI upset/nausea   Family History  Problem Relation Age of Onset  . Hypertension Father   . Lung cancer Father   . Stomach cancer Brother   . Healthy Sister   . Healthy Daughter   . Healthy Brother   . Healthy Brother   . Healthy Brother   . Healthy Sister   . Healthy Daughter   . Colon polyps Neg Hx   . Colon cancer Neg Hx   . Esophageal cancer Neg Hx   . Rectal cancer Neg Hx   . Inflammatory bowel disease Neg Hx   . Liver disease Neg Hx   . Pancreatic cancer Neg Hx    Social History   Socioeconomic History  . Marital status: Married    Spouse name: Inez Catalina  . Number of children: 2  . Years of education: 10  . Highest education level: 9th grade  Occupational History  . 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 Financial resource strain: Somewhat hard  . Food insecurity    Worry: Never true    Inability: Never true  . Transportation needs    Medical: No    Non-medical: No  Tobacco Use  . Smoking status: Never Smoker  . Smokeless tobacco: Never Used  Substance and Sexual Activity  . Alcohol use: No  . Drug use: No  . Sexual activity: Yes  Lifestyle  . Physical activity    Days per  week: 7 days    Minutes per session: 60 min  . Stress: Not at all  Relationships  . Social Herbalist on phone: Never    Gets together: Never    Attends religious service: Never    Active member of club or organization: No    Attends meetings of clubs or organizations: Never    Relationship status: Widowed  . Intimate partner violence    Fear of current or ex partner: No    Emotionally abused: No    Physically abused: No    Forced sexual activity: No  Other Topics Concern  . Not on file  Social History Narrative   Patient is right-handed. He lives with his wife in a two level home, Restaurant manager, fast food on the first floor.    Physical Exam: Vital signs in last 24 hours: Temp:  [97.5 F (36.4 C)] 97.5 F (36.4 C) (11/16 0655) Pulse Rate:  [72] 72 (11/16 0655) Resp:  [13] 13 (11/16 0655) BP: (123)/(83) 123/83 (11/16 0655) SpO2:  [95 %] 95 % (11/16 0655) Weight:  [67.5 kg] 67.5 kg (11/16 0655)   GEN: NAD EYE: Sclerae anicteric ENT: MMM CV: Non-tachycardic GI:  Soft, NT/ND NEURO:  Alert & Oriented x 3  Lab Results: No results for input(s): WBC, HGB, HCT, PLT in the last 72 hours. BMET No results for input(s): NA, K, CL, CO2, GLUCOSE, BUN, CREATININE, CALCIUM in the last 72 hours. LFT No results for input(s): PROT, ALBUMIN, AST, ALT, ALKPHOS, BILITOT, BILIDIR, IBILI in the last 72 hours. PT/INR No results for input(s): LABPROT, INR in the last 72 hours.   Impression / Plan: This is a 67 y.o.male who presents for Colonoscopy with EMR attempt.  The risks and benefits of endoscopic evaluation were discussed with the patient; these include but are not limited to the risk of perforation, infection, bleeding, missed lesions, lack of diagnosis, severe illness requiring hospitalization, as well as anesthesia and sedation related illnesses.  The patient is agreeable to proceed.    Justice Britain, MD [facility] # [id number]

## 2019-02-28 NOTE — Anesthesia Preprocedure Evaluation (Signed)
Anesthesia Evaluation  Patient identified by MRN, date of birth, ID band Patient awake    Reviewed: Allergy & Precautions, H&P , NPO status , Patient's Chart, lab work & pertinent test results  Airway Mallampati: II   Neck ROM: full    Dental   Pulmonary neg pulmonary ROS,    breath sounds clear to auscultation       Cardiovascular negative cardio ROS   Rhythm:regular Rate:Normal     Neuro/Psych    GI/Hepatic GERD  ,Colon polyps   Endo/Other    Renal/GU      Musculoskeletal   Abdominal   Peds  Hematology   Anesthesia Other Findings   Reproductive/Obstetrics                             Anesthesia Physical Anesthesia Plan  ASA: II  Anesthesia Plan: MAC   Post-op Pain Management:    Induction: Intravenous  PONV Risk Score and Plan: 1 and Propofol infusion and Treatment may vary due to age or medical condition  Airway Management Planned: Simple Face Mask  Additional Equipment:   Intra-op Plan:   Post-operative Plan:   Informed Consent: I have reviewed the patients History and Physical, chart, labs and discussed the procedure including the risks, benefits and alternatives for the proposed anesthesia with the patient or authorized representative who has indicated his/her understanding and acceptance.       Plan Discussed with: CRNA, Anesthesiologist and Surgeon  Anesthesia Plan Comments:         Anesthesia Quick Evaluation

## 2019-02-28 NOTE — Anesthesia Postprocedure Evaluation (Signed)
Anesthesia Post Note  Patient: Jonathan Burnett  Procedure(s) Performed: COLONOSCOPY WITH PROPOFOL (N/A ) ENDOSCOPIC MUCOSAL RESECTION (N/A ) SUBMUCOSAL LIFTING INJECTION HEMOSTASIS CLIP PLACEMENT FOREIGN BODY REMOVAL     Patient location during evaluation: Endoscopy Anesthesia Type: MAC Level of consciousness: awake and alert Pain management: pain level controlled Vital Signs Assessment: post-procedure vital signs reviewed and stable Respiratory status: spontaneous breathing, nonlabored ventilation, respiratory function stable and patient connected to nasal cannula oxygen Cardiovascular status: blood pressure returned to baseline and stable Postop Assessment: no apparent nausea or vomiting Anesthetic complications: no    Last Vitals:  Vitals:   02/28/19 0900 02/28/19 0905  BP: (!) 86/57 113/83  Pulse: 60   Resp: 13   Temp:    SpO2: 95%     Last Pain:  Vitals:   02/28/19 0920  TempSrc:   PainSc: 0-No pain                 [name] S

## 2019-02-28 NOTE — Op Note (Signed)
[facility] Patient Name: Jonathan Burnett Procedure Date : 02/28/2019 MRN: [id number] Attending MD: Justice Britain , MD Date of Birth: [birth year] CSN: [id number] Age: 68 Admit Type: Outpatient Procedure:                Colonoscopy Indications:              Excision of colonic polyp Providers:                Justice Britain, MD, Vista Lawman, RN, Elspeth Cho Tech., Technician, Lerry Paterson, CRNA Referring MD:             Fransisca Kaufmann. Dettinger Medicines:                Monitored Anesthesia Care Complications:            No immediate complications. Estimated Blood Loss:     Estimated blood loss was minimal. Procedure:                Pre-Anesthesia Assessment:                           - Prior to the procedure, a History and Physical                            was performed, and patient medications and                            allergies were reviewed. The patient's tolerance of                            previous anesthesia was also reviewed. The risks                            and benefits of the procedure and the sedation                            options and risks were discussed with the patient.                            All questions were answered, and informed consent                            was obtained. Prior Anticoagulants: The patient has                            taken no previous anticoagulant or antiplatelet                            agents. ASA Grade Assessment: II - A patient with                            mild systemic disease. After reviewing the risks  and benefits, the patient was deemed in                            satisfactory condition to undergo the procedure.                           After obtaining informed consent, the colonoscope                            was passed under direct vision. Throughout the                            procedure, the patient's blood pressure, pulse,  and                            oxygen saturations were monitored continuously. The                            PCF-H190DL ([id number] Olympus pediatric colonoscope                            was introduced through the anus and advanced to the                            5 cm into the ileum. The colonoscopy was performed                            without difficulty. The patient tolerated the                            procedure. The quality of the bowel preparation was                            adequate. The terminal ileum, ileocecal valve,                            appendiceal orifice, and rectum were photographed. Scope In: 7:51:52 AM Scope Out: 8:32:11 AM Scope Withdrawal Time: 0 hours 35 minutes 4 seconds  Total Procedure Duration: 0 hours 40 minutes 19 seconds  Findings:      The digital rectal exam findings include hemorrhoids. Pertinent       negatives include no palpable rectal lesions.      The terminal ileum and ileocecal valve appeared normal.      A 25 mm polyp was found in the proximal ascending colon. The polyp was       semi-sessile. Preparations were made for mucosal resection. Orise gel       was injected to raise the lesion. Although the majority was felt to be       in the snare on first resection, it was noted there was a small piece       remaining. Thus piecemeal mucosal resection using a snare was performed.       Resection and retrieval were complete. Coagulation for tissue       destruction using snare tip was successful to the edge of resection to  decrease risk of recurrent polyp. To prevent bleeding after mucosal       resection, four hemostatic clips were successfully placed (MR       conditional). There was no bleeding during, or at the end, of the       procedure.      A single small angioectasia without bleeding was found in the distal       ascending colon - previously noted on last colonoscopy but did not make       it into report (picture was  present).      Multiple small-mouthed diverticula were found in the recto-sigmoid colon       and sigmoid colon.      Non-bleeding non-thrombosed internal hemorrhoids were found during       retroflexion, during perianal exam and during digital exam. The       hemorrhoids were Grade II (internal hemorrhoids that prolapse but reduce       spontaneously). Impression:               - Hemorrhoids found on digital rectal exam.                           - The examined portion of the ileum was normal.                           - One 25 mm polyp in the proximal ascending colon,                            removed with mucosal resection. Resected and                            retrieved. Fulguration to the edge to decrease                            recurrence risk. Clips (MR conditional) were placed.                           - A single non-bleeding colonic angioectasia.                           - Diverticulosis in the recto-sigmoid colon and in                            the sigmoid colon.                           - Non-bleeding non-thrombosed internal hemorrhoids. Recommendation:           - The patient will be observed post-procedure,                            until all discharge criteria are met.                           - Discharge patient to home.                           - Patient  has a contact number available for                            emergencies. The signs and symptoms of potential                            delayed complications were discussed with the                            patient. Return to normal activities tomorrow.                            Written discharge instructions were provided to the                            patient.                           - High fiber diet.                           - Use FiberCon 1 tablet PO daily.                           - Continue present medications.                           - No aspirin, ibuprofen, naproxen, or other                             non-steroidal anti-inflammatory drugs for 2 weeks                            after polyp removal.                           - Await pathology results.                           - Repeat colonoscopy in 6 months for surveillance.                           - The findings and recommendations were discussed                            with the patient. Procedure Code(s):        --- Professional ---                           [phone], Colonoscopy, flexible; with endoscopic                            mucosal resection Diagnosis Code(s):        --- Professional ---                           K64.1, Second degree hemorrhoids  K63.5, Polyp of colon                           K55.20, Angiodysplasia of colon without hemorrhage                           K57.30, Diverticulosis of large intestine without                            perforation or abscess without bleeding CPT copyright 2019 American Medical Association. All rights reserved. The codes documented in this report are preliminary and upon coder review may  be revised to meet current compliance requirements. Justice Britain, MD 02/28/2019 8:49:47 AM Number of Addenda: 0

## 2019-03-01 ENCOUNTER — Encounter (HOSPITAL_COMMUNITY): Payer: Self-pay | Admitting: Gastroenterology

## 2019-03-01 LAB — SURGICAL PATHOLOGY

## 2019-03-01 NOTE — Telephone Encounter (Signed)
Patty, could you check in on patient this AM?

## 2019-03-01 NOTE — Telephone Encounter (Signed)
Patty, can you check in on patient this AM? Thanks. GM

## 2019-03-01 NOTE — Telephone Encounter (Signed)
No answer and no voice mail.

## 2019-03-01 NOTE — Telephone Encounter (Signed)
Was able to reach the patient this afternoon. Pain is completely resolved. He is having no further bowel movements but has been eating and drinking well. I also quickly discussed with him the results of the pathology specimen which did show tubular adenoma without any high-grade dysplasia. I will send a letter with the final results and he will receive that. Plan will be for a 15-month follow-up colonoscopy with EMR which we will schedule down the road. Patient appreciative for the call back. I will close this telephone exchange now.  Justice Britain, MD [facility] # [id number]

## 2019-03-02 ENCOUNTER — Encounter: Payer: Self-pay | Admitting: Gastroenterology

## 2019-08-12 ENCOUNTER — Telehealth: Payer: Self-pay

## 2019-08-12 NOTE — Telephone Encounter (Signed)
Yes please can you just print them for me thank you

## 2019-08-12 NOTE — Telephone Encounter (Signed)
-----   Message from Doristine Church, EOT sent at 08/12/2019  8:43 AM EDT ----- Chong Sicilian, I have a few more that are hospital recalls including this patient. I'm sure you probably already have them, but would like me to send them to you? Make copies of the recall sheets ? Please let me know.  Thanks, Abigail Butts

## 2019-08-29 ENCOUNTER — Telehealth: Payer: Self-pay | Admitting: Gastroenterology

## 2019-08-29 ENCOUNTER — Other Ambulatory Visit: Payer: Self-pay

## 2019-08-29 DIAGNOSIS — Z86010 Personal history of colonic polyps: Secondary | ICD-10-CM

## 2019-08-29 DIAGNOSIS — R933 Abnormal findings on diagnostic imaging of other parts of digestive tract: Secondary | ICD-10-CM

## 2019-08-29 DIAGNOSIS — D126 Benign neoplasm of colon, unspecified: Secondary | ICD-10-CM

## 2019-08-29 NOTE — Telephone Encounter (Signed)
Patient's wife called to schedule recall at hospital

## 2019-08-29 NOTE — Telephone Encounter (Signed)
Colon EMR scheduled for 10/24/19 at [facility] at 130 pm.  Previsit also scheduled so pt can review double prep.  Covid appt scheduled for 10/20/19 at 230 pm. The pt wife was advised of all appts.  The pt has been advised of the information and verbalized understanding.

## 2019-10-13 ENCOUNTER — Encounter: Payer: Self-pay | Admitting: Family Medicine

## 2019-10-13 ENCOUNTER — Other Ambulatory Visit: Payer: Self-pay

## 2019-10-13 ENCOUNTER — Ambulatory Visit (INDEPENDENT_AMBULATORY_CARE_PROVIDER_SITE_OTHER): Payer: Medicare Other | Admitting: Family Medicine

## 2019-10-13 VITALS — BP 120/78 | HR 62 | Temp 97.6°F | Ht 68.5 in | Wt 148.0 lb

## 2019-10-13 DIAGNOSIS — E782 Mixed hyperlipidemia: Secondary | ICD-10-CM

## 2019-10-13 DIAGNOSIS — N529 Male erectile dysfunction, unspecified: Secondary | ICD-10-CM

## 2019-10-13 DIAGNOSIS — K219 Gastro-esophageal reflux disease without esophagitis: Secondary | ICD-10-CM

## 2019-10-13 MED ORDER — SILDENAFIL CITRATE 20 MG PO TABS: 20.0000 mg | ORAL_TABLET | ORAL | 1 refills | Status: DC | PRN

## 2019-10-13 MED ORDER — ICOSAPENT ETHYL 1 G PO CAPS: 1.0000 g | ORAL_CAPSULE | Freq: Two times a day (BID) | ORAL | 3 refills | Status: DC

## 2019-10-13 NOTE — Progress Notes (Signed)
 BP 120/78   Pulse 62   Temp 97.6 F (36.4 C)   Ht 5' 8.5" (1.74 m)   Wt 148 lb (67.1 kg)   SpO2 98%   BMI 22.18 kg/m    Subjective:   Patient ID: Jonathan Burnett, male    DOB: 05/03/1950, 69 y.o.   MRN: [id number]  HPI: Jonathan Burnett is a 69 y.o. male presenting on 10/13/2019 for Medical Management of Chronic Issues and Hyperlipidemia   HPI Hyperlipidemia Patient is coming in for recheck of his hyperlipidemia. The patient is currently taking Vascepa. They deny any issues with myalgias or history of liver damage from it. They deny any focal numbness or weakness or chest pain.   Patient is coming in complaining of erectile dysfunction, he says it has been happening more frequently in the he is not able to get worse and staying on erection and he would like to try some Viagra.  He has never tried it before.  GERD Patient is currently on no medication currently.  She denies any major symptoms or abdominal pain or belching or burping. She denies any blood in her stool or lightheadedness or dizziness.   Relevant past medical, surgical, family and social history reviewed and updated as indicated. Interim medical history since our last visit reviewed. Allergies and medications reviewed and updated.  Review of Systems  Constitutional: Negative for chills and fever.  Eyes: Negative for visual disturbance.  Respiratory: Negative for shortness of breath and wheezing.   Cardiovascular: Negative for chest pain and leg swelling.  Musculoskeletal: Negative for back pain and gait problem.  Skin: Negative for rash.  Neurological: Negative for dizziness, weakness and light-headedness.  All other systems reviewed and are negative.   Per HPI unless specifically indicated above   Allergies as of 10/13/2019      Reactions   Aspirin    REACTION: GI upset/nausea      Medication List       Accurate as of October 13, 2019 11:26 AM. If you have any questions, ask your nurse or doctor.        STOP  taking these medications   ciprofloxacin 500 MG tablet Commonly known as: CIPRO Stopped by:  A , MD   metroNIDAZOLE 500 MG tablet Commonly known as: FLAGYL Stopped by:  A , MD   prednisoLONE acetate 1 % ophthalmic suspension Commonly known as: PRED FORTE Stopped by:  A , MD     TAKE these medications   Vascepa 1 g capsule Generic drug: icosapent Ethyl Take 1 capsule (1 g total) by mouth 2 (two) times daily.        Objective:   BP 120/78   Pulse 62   Temp 97.6 F (36.4 C)   Ht 5' 8.5" (1.74 m)   Wt 148 lb (67.1 kg)   SpO2 98%   BMI 22.18 kg/m   Wt Readings from Last 3 Encounters:  10/13/19 148 lb (67.1 kg)  02/28/19 148 lb 12.8 oz (67.5 kg)  01/18/19 148 lb 12.8 oz (67.5 kg)    Physical Exam Vitals and nursing note reviewed.  Constitutional:      General: He is not in acute distress.    Appearance: He is well-developed. He is not diaphoretic.  Eyes:     General: No scleral icterus.    Conjunctiva/sclera: Conjunctivae normal.  Neck:     Thyroid: No thyromegaly.  Cardiovascular:     Rate and Rhythm: Normal rate and regular rhythm.       Heart sounds: Normal heart sounds. No murmur heard.   Pulmonary:     Effort: Pulmonary effort is normal. No respiratory distress.     Breath sounds: Normal breath sounds. No wheezing.  Musculoskeletal:        General: Normal range of motion.     Cervical back: Neck supple.  Lymphadenopathy:     Cervical: No cervical adenopathy.  Skin:    General: Skin is warm and dry.     Findings: No rash.  Neurological:     Mental Status: He is alert and oriented to person, place, and time.     Coordination: Coordination normal.  Psychiatric:        Behavior: Behavior normal.       Assessment & Plan:   Problem List Items Addressed This Visit      Digestive   GERD (gastroesophageal reflux disease)   Relevant Orders   CBC with Differential/Platelet (Completed)   CMP14+EGFR  (Completed)     Other   HLD (hyperlipidemia) - Primary   Relevant Medications   sildenafil (REVATIO) 20 MG tablet   icosapent Ethyl (VASCEPA) 1 g capsule   Other Relevant Orders   CMP14+EGFR (Completed)   Lipid panel (Completed)   Erectile dysfunction   Relevant Orders   CMP14+EGFR (Completed)      Gave prescription for Viagra, continue Vascepa and will check blood work today. Follow up plan: Return in about 1 year (around 10/12/2020), or if symptoms worsen or fail to improve, for Hyperlipidemia.  Counseling provided for all of the vaccine components No orders of the defined types were placed in this encounter.    , MD [facility] 10/13/2019, 11:26 AM     

## 2019-10-14 ENCOUNTER — Other Ambulatory Visit: Payer: Self-pay

## 2019-10-14 ENCOUNTER — Ambulatory Visit (AMBULATORY_SURGERY_CENTER): Payer: Self-pay | Admitting: *Deleted

## 2019-10-14 VITALS — Ht 68.5 in | Wt 145.0 lb

## 2019-10-14 DIAGNOSIS — Z01818 Encounter for other preprocedural examination: Secondary | ICD-10-CM

## 2019-10-14 DIAGNOSIS — Z86010 Personal history of colonic polyps: Secondary | ICD-10-CM

## 2019-10-14 LAB — CMP14+EGFR
ALT: 14 IU/L (ref 0–44)
AST: 16 IU/L (ref 0–40)
Albumin/Globulin Ratio: 1.6 (ref 1.2–2.2)
Albumin: 4.1 g/dL (ref 3.8–4.8)
Alkaline Phosphatase: 75 IU/L (ref 48–121)
BUN/Creatinine Ratio: 12 (ref 10–24)
BUN: 13 mg/dL (ref 8–27)
Bilirubin Total: 0.4 mg/dL (ref 0.0–1.2)
CO2: 23 mmol/L (ref 20–29)
Calcium: 9.5 mg/dL (ref 8.6–10.2)
Chloride: 105 mmol/L (ref 96–106)
Creatinine, Ser: 1.07 mg/dL (ref 0.76–1.27)
GFR calc Af Amer: 81 mL/min/{1.73_m2} (ref >59)
GFR calc non Af Amer: 70 mL/min/{1.73_m2} (ref >59)
Globulin, Total: 2.6 g/dL (ref 1.5–4.5)
Glucose: 83 mg/dL (ref 65–99)
Potassium: 4.7 mmol/L (ref 3.5–5.2)
Sodium: 139 mmol/L (ref 134–144)
Total Protein: 6.7 g/dL (ref 6.0–8.5)

## 2019-10-14 LAB — CBC WITH DIFFERENTIAL/PLATELET
Basophils Absolute: 0.1 10*3/uL (ref 0.0–0.2)
Basos: 1 %
EOS (ABSOLUTE): 0.4 10*3/uL (ref 0.0–0.4)
Eos: 4 %
Hematocrit: 44.1 % (ref 37.5–51.0)
Hemoglobin: 14.8 g/dL (ref 13.0–17.7)
Immature Grans (Abs): 0.1 10*3/uL (ref 0.0–0.1)
Immature Granulocytes: 1 %
Lymphocytes Absolute: 2.8 10*3/uL (ref 0.7–3.1)
Lymphs: 28 %
MCH: 31.1 pg (ref 26.6–33.0)
MCHC: 33.6 g/dL (ref 31.5–35.7)
MCV: 93 fL (ref 79–97)
Monocytes Absolute: 0.6 10*3/uL (ref 0.1–0.9)
Monocytes: 6 %
Neutrophils Absolute: 5.9 10*3/uL (ref 1.4–7.0)
Neutrophils: 60 %
Platelets: 250 10*3/uL (ref 150–450)
RBC: 4.76 x10E6/uL (ref 4.14–5.80)
RDW: 13 % (ref 11.6–15.4)
WBC: 9.9 10*3/uL (ref 3.4–10.8)

## 2019-10-14 LAB — LIPID PANEL
Chol/HDL Ratio: 4.1 ratio (ref 0.0–5.0)
Cholesterol, Total: 142 mg/dL (ref 100–199)
HDL: 35 mg/dL — ABNORMAL LOW (ref >39)
LDL Chol Calc (NIH): 87 mg/dL (ref 0–99)
Triglycerides: 107 mg/dL (ref 0–149)
VLDL Cholesterol Cal: 20 mg/dL (ref 5–40)

## 2019-10-14 MED ORDER — SUTAB 1479-225-188 MG PO TABS: 24.0000 | ORAL_TABLET | ORAL | 0 refills | Status: DC

## 2019-10-14 NOTE — Progress Notes (Signed)
No egg or soy allergy known to patient  No issues with past sedation with any surgeries  or procedures, no intubation problems  No diet pills per patient No home 02 use per patient  No blood thinners per patient  Pt denies issues with constipation  No A fib or A flutter  EMMI video sent to pt's e mail  COVID 19 guidelines implemented in PV today   Instructions changed to [facility] not [place] and given in PV to [employer] day [employer] per [employer] notes    Due to the COVID-19 pandemic we are asking patients to follow these guidelines. Please only bring one care partner. Please be aware that your care partner may wait in the car in the parking lot or if they feel like they will be too hot to wait in the car, they may wait in the lobby on the 4th floor. All care partners are required to wear a mask the entire time (we do not have any that we can provide them), they need to practice social distancing, and we will do a Covid check for all patient's and care partners when you arrive. Also we will check their temperature and your temperature. If the care partner waits in their car they need to stay in the parking lot the entire time and we will call them on their cell phone when the patient is ready for discharge so they can bring the car to the front of the building. Also all patient's will need to wear a mask into building.

## 2019-10-20 ENCOUNTER — Other Ambulatory Visit (HOSPITAL_COMMUNITY)
Admission: RE | Admit: 2019-10-20 | Discharge: 2019-10-20 | Disposition: A | Discharge status: Home / Self Care | Payer: Medicare Other | Source: Ambulatory Visit | Attending: Gastroenterology | Admitting: Gastroenterology

## 2019-10-20 DIAGNOSIS — Z20822 Contact with and (suspected) exposure to covid-19: Secondary | ICD-10-CM | POA: Diagnosis not present

## 2019-10-20 DIAGNOSIS — Z01812 Encounter for preprocedural laboratory examination: Secondary | ICD-10-CM | POA: Insufficient documentation

## 2019-10-20 LAB — SARS CORONAVIRUS 2 (TAT 6-24 HRS): SARS Coronavirus 2: NEGATIVE

## 2019-10-21 ENCOUNTER — Encounter (HOSPITAL_COMMUNITY): Payer: Self-pay | Admitting: Gastroenterology

## 2019-10-21 NOTE — Progress Notes (Signed)
Spoke with Wife Inez Catalina.  Inez Catalina states patient does not have any shortness of breath, fever, cough or chest pain.  PCP - Dr Vonna Kotyk Dettinger Cardiologist - [facility] - Dr Marilynne Halsted  Chest x-ray - n/a EKG - n/a Stress Test - n/a ECHO - n/a Cardiac Cath - n/a  ERAS - Clears til 11:30 am DOS, no drink.  STOP now taking any Aspirin (unless otherwise instructed by your surgeon), Aleve, Naproxen, Ibuprofen, Motrin, Advil, Goody's, BC's, all herbal medications, fish oil, and all vitamins.   Coronavirus Screening Covid test on 10/20/19 was negative.  Patient verbalized understanding of instructions that were given via phone.

## 2019-10-23 NOTE — Anesthesia Preprocedure Evaluation (Addendum)
Anesthesia Evaluation  Patient identified by MRN, date of birth, ID band Patient awake    Reviewed: Allergy & Precautions, NPO status , Patient's Chart, lab work & pertinent test results  Airway Mallampati: II  TM Distance: >3 FB Neck ROM: Full    Dental no notable dental hx. (+) Upper Dentures, Lower Dentures   Pulmonary neg pulmonary ROS,    Pulmonary exam normal breath sounds clear to auscultation       Cardiovascular negative cardio ROS Normal cardiovascular exam Rhythm:Regular Rate:Normal     Neuro/Psych negative neurological ROS  negative psych ROS   GI/Hepatic Neg liver ROS, GERD  ,  Endo/Other  negative endocrine ROS  Renal/GU negative Renal ROS     Musculoskeletal negative musculoskeletal ROS (+)   Abdominal   Peds negative pediatric ROS (+)  Hematology negative hematology ROS (+) anemia ,   Anesthesia Other Findings   Reproductive/Obstetrics                            Anesthesia Physical Anesthesia Plan  ASA: II  Anesthesia Plan: MAC   Post-op Pain Management:    Induction:   PONV Risk Score and Plan: Treatment may vary due to age or medical condition  Airway Management Planned: Nasal Cannula and Natural Airway  Additional Equipment: None  Intra-op Plan:   Post-operative Plan:   Informed Consent: I have reviewed the patients History and Physical, chart, labs and discussed the procedure including the risks, benefits and alternatives for the proposed anesthesia with the patient or authorized representative who has indicated his/her understanding and acceptance.     Dental advisory given  Plan Discussed with: CRNA  Anesthesia Plan Comments:        Anesthesia Quick Evaluation

## 2019-10-24 ENCOUNTER — Encounter (HOSPITAL_COMMUNITY): Payer: Self-pay | Admitting: Gastroenterology

## 2019-10-24 ENCOUNTER — Ambulatory Visit (HOSPITAL_COMMUNITY)
Admission: RE | Admit: 2019-10-24 | Discharge: 2019-10-24 | Disposition: A | Discharge status: Home / Self Care | Payer: Medicare Other | Attending: Gastroenterology | Admitting: Gastroenterology

## 2019-10-24 ENCOUNTER — Ambulatory Visit (HOSPITAL_COMMUNITY): Payer: Medicare Other | Admitting: Anesthesiology

## 2019-10-24 ENCOUNTER — Encounter (HOSPITAL_COMMUNITY)
Admission: RE | Disposition: A | Discharge status: Home / Self Care | Payer: Self-pay | Source: Home / Self Care | Attending: Gastroenterology

## 2019-10-24 ENCOUNTER — Other Ambulatory Visit: Payer: Self-pay

## 2019-10-24 DIAGNOSIS — Z86010 Personal history of colonic polyps: Secondary | ICD-10-CM | POA: Insufficient documentation

## 2019-10-24 DIAGNOSIS — K5520 Angiodysplasia of colon without hemorrhage: Secondary | ICD-10-CM | POA: Diagnosis not present

## 2019-10-24 DIAGNOSIS — R933 Abnormal findings on diagnostic imaging of other parts of digestive tract: Secondary | ICD-10-CM

## 2019-10-24 DIAGNOSIS — K642 Third degree hemorrhoids: Secondary | ICD-10-CM | POA: Insufficient documentation

## 2019-10-24 DIAGNOSIS — K219 Gastro-esophageal reflux disease without esophagitis: Secondary | ICD-10-CM | POA: Insufficient documentation

## 2019-10-24 DIAGNOSIS — D122 Benign neoplasm of ascending colon: Secondary | ICD-10-CM | POA: Insufficient documentation

## 2019-10-24 DIAGNOSIS — D126 Benign neoplasm of colon, unspecified: Secondary | ICD-10-CM

## 2019-10-24 DIAGNOSIS — Z886 Allergy status to analgesic agent status: Secondary | ICD-10-CM | POA: Insufficient documentation

## 2019-10-24 DIAGNOSIS — K5730 Diverticulosis of large intestine without perforation or abscess without bleeding: Secondary | ICD-10-CM | POA: Insufficient documentation

## 2019-10-24 DIAGNOSIS — Z9889 Other specified postprocedural states: Secondary | ICD-10-CM | POA: Diagnosis not present

## 2019-10-24 HISTORY — PX: POLYPECTOMY: SHX5525

## 2019-10-24 HISTORY — DX: Presence of dental prosthetic device (complete) (partial): Z97.2

## 2019-10-24 HISTORY — DX: Presence of spectacles and contact lenses: Z97.3

## 2019-10-24 HISTORY — PX: COLONOSCOPY WITH PROPOFOL: SHX5780

## 2019-10-24 HISTORY — DX: Anemia, unspecified: D64.9

## 2019-10-24 HISTORY — DX: Male erectile dysfunction, unspecified: N52.9

## 2019-10-24 SURGERY — COLONOSCOPY WITH PROPOFOL
Anesthesia: Monitor Anesthesia Care

## 2019-10-24 MED ORDER — LACTATED RINGERS IV SOLN: INTRAVENOUS | Status: DC

## 2019-10-24 MED ORDER — SODIUM CHLORIDE 0.9 % IV SOLN: INTRAVENOUS | Status: DC

## 2019-10-24 MED ORDER — PROPOFOL 500 MG/50ML IV EMUL
INTRAVENOUS | Status: DC | PRN
  Administered 2019-10-24: 100 ug/kg/min via INTRAVENOUS

## 2019-10-24 MED ORDER — PROPOFOL 10 MG/ML IV BOLUS
INTRAVENOUS | Status: DC | PRN
  Administered 2019-10-24: 40 mg via INTRAVENOUS

## 2019-10-24 MED ORDER — EPHEDRINE SULFATE-NACL 50-0.9 MG/10ML-% IV SOSY
PREFILLED_SYRINGE | INTRAVENOUS | Status: DC | PRN
  Administered 2019-10-24: 10 mg via INTRAVENOUS

## 2019-10-24 SURGICAL SUPPLY — 22 items

## 2019-10-24 NOTE — H&P (Signed)
GASTROENTEROLOGY PROCEDURE H&P NOTE   Primary Care Physician: Dettinger, Fransisca Kaufmann, MD  HPI: Jonathan Burnett is a 69 y.o. male who presents for Colonoscopy with EMR attempt for followup of previous large polyp EMR.  Past Medical History:  Diagnosis Date  . Anemia   . Blood transfusion without reported diagnosis    with appendix rupture   . Colon polyp   . Diverticulosis   . ED (erectile dysfunction)   . GERD (gastroesophageal reflux disease)    diet controlled  . Hyperlipidemia   . Hypoglycemia   . Wears dentures full  . Wears glasses    Past Surgical History:  Procedure Laterality Date  . APPENDECTOMY    . CHOLECYSTECTOMY    . COLONOSCOPY    . COLONOSCOPY WITH PROPOFOL N/A 02/28/2019   Procedure: COLONOSCOPY WITH PROPOFOL;  Surgeon: Rush Landmark Telford Nab., MD;  Location: [place];  Service: Gastroenterology;  Laterality: N/A;  . ENDOSCOPIC MUCOSAL RESECTION N/A 02/28/2019   Procedure: ENDOSCOPIC MUCOSAL RESECTION;  Surgeon: Rush Landmark Telford Nab., MD;  Location: [place];  Service: Gastroenterology;  Laterality: N/A;  . HEMOSTASIS CLIP PLACEMENT  02/28/2019   Procedure: HEMOSTASIS CLIP PLACEMENT;  Surgeon: Irving Copas., MD;  Location: [place];  Service: Gastroenterology;;  . POLYPECTOMY    . SUBMUCOSAL LIFTING INJECTION  02/28/2019   Procedure: SUBMUCOSAL LIFTING INJECTION;  Surgeon: Rush Landmark Telford Nab., MD;  Location: [place];  Service: Gastroenterology;;   Current Facility-Administered Medications  Medication Dose Route Frequency Provider Last Rate Last Admin  . lactated ringers infusion   Intravenous Continuous Mansouraty, Telford Nab., MD 20 mL/hr at 10/24/19 0923 New Bag at 10/24/19 2355   Allergies  Allergen Reactions  . Aspirin     GI upset/nausea   Family History  Problem Relation Age of Onset  . Hypertension Father   . Lung cancer Father   . Stomach cancer Brother   . Healthy Sister   . Healthy Daughter   . Healthy  Brother   . Healthy Brother   . Healthy Brother   . Healthy Sister   . Healthy Daughter   . Colon polyps Neg Hx   . Colon cancer Neg Hx   . Esophageal cancer Neg Hx   . Rectal cancer Neg Hx   . Inflammatory bowel disease Neg Hx   . Liver disease Neg Hx   . Pancreatic cancer Neg Hx    Social History   Socioeconomic History  . Marital status: Married    Spouse name: Inez Catalina  . Number of children: 2  . Years of education: 10  . Highest education level: 9th grade  Occupational History  . Occupation: English as a second language teacher: [employer]    Comment: Retired  Tobacco Use  . Smoking status: Never Smoker  . Smokeless tobacco: Never Used  Vaping Use  . Vaping Use: Never used  Substance and Sexual Activity  . Alcohol use: No  . Drug use: No  . Sexual activity: Yes  Other Topics Concern  . Not on file  Social History Narrative   Patient is right-handed. He lives with his wife in a two level home, Restaurant manager, fast food on the first floor.   Social Determinants of Health   Financial Resource Strain:   . Difficulty of Paying Living Expenses:   Food Insecurity:   . Worried About Charity fundraiser in the Last Year:   . [place] in the Last Year:   Transportation Needs:   . Lack of  Transportation (Medical):   Marland Kitchen Lack of Transportation (Non-Medical):   Physical Activity:   . Days of Exercise per Week:   . Minutes of Exercise per Session:   Stress:   . Feeling of Stress :   Social Connections:   . Frequency of Communication with Friends and Family:   . Frequency of Social Gatherings with Friends and Family:   . Attends Religious Services:   . Active Member of Clubs or Organizations:   . Attends Archivist Meetings:   Marland Kitchen Marital Status:   Intimate Partner Violence:   . Fear of Current or Ex-Partner:   . Emotionally Abused:   Marland Kitchen Physically Abused:   . Sexually Abused:     Physical Exam: Vital signs in last 24 hours: Temp:  [97.9 F (36.6 C)] 97.9 F (36.6 C) (07/12  0915) Pulse Rate:  [81] 81 (07/12 0915) Resp:  [13] 13 (07/12 0915) BP: (122)/(80) 122/80 (07/12 0915) SpO2:  [96 %] 96 % (07/12 0915) Weight:  [70.3 kg] 70.3 kg (07/12 0915)   GEN: NAD EYE: Sclerae anicteric ENT: MMM CV: Non-tachycardic GI: Soft, NT/ND NEURO:  Alert & Oriented x 3  Lab Results: No results for input(s): WBC, HGB, HCT, PLT in the last 72 hours. BMET No results for input(s): NA, K, CL, CO2, GLUCOSE, BUN, CREATININE, CALCIUM in the last 72 hours. LFT No results for input(s): PROT, ALBUMIN, AST, ALT, ALKPHOS, BILITOT, BILIDIR, IBILI in the last 72 hours. PT/INR No results for input(s): LABPROT, INR in the last 72 hours.   Impression / Plan: This is a 69 y.o.male who presents for Colonoscopy with EMR attempt for followup of previous large polyp EMR.  The risks and benefits of endoscopic evaluation were discussed with the patient; these include but are not limited to the risk of perforation, infection, bleeding, missed lesions, lack of diagnosis, severe illness requiring hospitalization, as well as anesthesia and sedation related illnesses.  The patient is agreeable to proceed.    Justice Britain, MD [facility] Office # [phone]

## 2019-10-24 NOTE — Anesthesia Postprocedure Evaluation (Signed)
Anesthesia Post Note  Patient: Jonathan Burnett  Procedure(s) Performed: COLONOSCOPY WITH PROPOFOL (N/A ) POLYPECTOMY     Patient location during evaluation: Endoscopy Anesthesia Type: MAC Level of consciousness: awake and alert Pain management: pain level controlled Vital Signs Assessment: post-procedure vital signs reviewed and stable Respiratory status: spontaneous breathing, nonlabored ventilation, respiratory function stable and patient connected to nasal cannula oxygen Cardiovascular status: blood pressure returned to baseline and stable Postop Assessment: no apparent nausea or vomiting Anesthetic complications: no   No complications documented.  Last Vitals:  Vitals:   10/24/19 1055 10/24/19 1110  BP: 110/82 120/84  Pulse: 66 75  Resp: 15 (!) 21  Temp:  (!) 36.4 C  SpO2: 97% 94%    Last Pain:  Vitals:   10/24/19 1055  TempSrc:   PainSc: 0-No pain                 Barnet Glasgow

## 2019-10-24 NOTE — Transfer of Care (Signed)
Immediate Anesthesia Transfer of Care Note  Patient: Breon Rehm  Procedure(s) Performed: COLONOSCOPY WITH PROPOFOL (N/A ) POLYPECTOMY  Patient Location: PACU  Anesthesia Type:MAC  Level of Consciousness: drowsy and patient cooperative  Airway & Oxygen Therapy: Patient Spontanous Breathing  Post-op Assessment: Report given to RN and Post -op Vital signs reviewed and stable  Post vital signs: Reviewed and stable  Last Vitals:  Vitals Value Taken Time  BP 101/70 10/24/19 1026  Temp    Pulse 73 10/24/19 1026  Resp 26 10/24/19 1026  SpO2 97 % 10/24/19 1026  Vitals shown include unvalidated device data.  Last Pain:  Vitals:   10/24/19 0915  TempSrc: Oral  PainSc: 0-No pain         Complications: No complications documented.

## 2019-10-24 NOTE — Op Note (Signed)
[facility] Patient Name: Jonathan Burnett Procedure Date : 10/24/2019 MRN: [id number] Attending MD: Justice Britain , MD Date of Birth: 01-15-1951 CSN: [id number] Age: 69 Admit Type: Inpatient Procedure:                Colonoscopy Indications:              Surveillance: Personal history of piecemeal removal                            of adenoma on last colonoscopy (less than 1 year                            ago) Providers:                Justice Britain, MD, Carlyn Reichert, RN, Theodora Blow, Technician Referring MD:             Fransisca Kaufmann. Dettinger Medicines:                Monitored Anesthesia Care Complications:            No immediate complications. Estimated Blood Loss:     Estimated blood loss was minimal. Procedure:                Pre-Anesthesia Assessment:                           - Prior to the procedure, a History and Physical                            was performed, and patient medications and                            allergies were reviewed. The patient's tolerance of                            previous anesthesia was also reviewed. The risks                            and benefits of the procedure and the sedation                            options and risks were discussed with the patient.                            All questions were answered, and informed consent                            was obtained. Prior Anticoagulants: The patient has                            taken no previous anticoagulant or antiplatelet                            agents.  ASA Grade Assessment: III - A patient with                            severe systemic disease. After reviewing the risks                            and benefits, the patient was deemed in                            satisfactory condition to undergo the procedure.                           After obtaining informed consent, the colonoscope                            was passed under  direct vision. Throughout the                            procedure, the patient's blood pressure, pulse, and                            oxygen saturations were monitored continuously. The                            PCF-[id number] ([id number] Olympus pediatric colonscope                            was introduced through the anus and advanced to the                            the cecum, identified by appendiceal orifice and                            ileocecal valve. The colonoscopy was performed                            without difficulty. The patient tolerated the                            procedure. The quality of the bowel preparation was                            adequate. The terminal ileum, ileocecal valve,                            appendiceal orifice, and rectum were photographed. Scope In: 9:44:30 AM Scope Out: 10:19:03 AM Scope Withdrawal Time: 0 hours 27 minutes 22 seconds  Total Procedure Duration: 0 hours 34 minutes 33 seconds  Findings:      The digital rectal exam findings include hemorrhoids. Pertinent       negatives include no palpable rectal lesions.      The terminal ileum and ileocecal valve appeared normal.      A medium post mucosectomy scar was found in the proximal ascending  colon. There was some normal appearing residual polypoid tissue - likely       granulation more than adenomatous, but to be sure, preparations were       made for mucosal resection. Piecemeal mucosal resection using a snare       was performed.      A single small angioectasia with typical arborization was found in the       proximal ascending colon.      Multiple small-mouthed diverticula were found in the recto-sigmoid colon       and sigmoid colon.      Normal mucosa was found in the entire colon otherwise.      Non-bleeding non-thrombosed external and internal hemorrhoids were found       during retroflexion, during perianal exam and during digital exam. The       hemorrhoids were  Grade III (internal hemorrhoids that prolapse but       require manual reduction). Impression:               - Hemorrhoids found on digital rectal exam.                           - The examined portion of the ileum was normal.                           - Post mucosectomy scar in the proximal ascending                            colon. There was some polypoid tissue noted in                            region. Resected via mucosal resection for rule out                            of adenomatous tissue.                           - A single colonic angioectasia in proximal AC.                           - Diverticulosis in the recto-sigmoid colon and in                            the sigmoid colon.                           - Normal mucosa in the entire examined colon                            otherwise.                           - Non-bleeding non-thrombosed external and internal                            hemorrhoids. Recommendation:           - The patient will be observed post-procedure,  until all discharge criteria are met.                           - Discharge patient to home.                           - Patient has a contact number available for                            emergencies. The signs and symptoms of potential                            delayed complications were discussed with the                            patient. Return to normal activities tomorrow.                            Written discharge instructions were provided to the                            patient.                           - Resume previous diet.                           - Continue present medications.                           - Await pathology results.                           - Monitor for signs/symptoms of bleeding,                            perforation, and infection. If issues please call                            our number to get further assistance as needed.                            - Repeat colonoscopy in 6 months for surveillance                            based on pathology results if any evidence of                            adentomatous tissue; will need Endorotor v FTRD at                            that time. If no evidence of adenomatous tissue                            then follow up in 1-year in [place] with hope  to return                            to normal screening-surveillance protocol                            thereafter.                           - The findings and recommendations were discussed                            with the patient.                           - The findings and recommendations were discussed                            with the patient's family. Procedure Code(s):        --- Professional ---                           [phone], Colonoscopy, flexible; with endoscopic                            mucosal resection Diagnosis Code(s):        --- Professional ---                           K64.2, Third degree hemorrhoids                           K55.20, Angiodysplasia of colon without hemorrhage                           Z98.890, Other specified postprocedural states                           Z09, Encounter for follow-up examination after                            completed treatment for conditions other than                            malignant neoplasm                           Z86.010, Personal history of colonic polyps                           K57.30, Diverticulosis of large intestine without                            perforation or abscess without bleeding CPT copyright 2019 American Medical Association. All rights reserved. The codes documented in this report are preliminary and upon coder review may  be revised to meet current compliance requirements. Justice Britain, MD 10/24/2019 10:35:03 AM Number of Addenda: 0

## 2019-10-25 LAB — SURGICAL PATHOLOGY

## 2019-10-26 ENCOUNTER — Encounter (HOSPITAL_COMMUNITY): Payer: Self-pay | Admitting: Gastroenterology

## 2019-11-01 ENCOUNTER — Encounter: Payer: Self-pay | Admitting: Gastroenterology

## 2019-12-27 ENCOUNTER — Telehealth: Payer: Self-pay | Admitting: Family Medicine

## 2019-12-27 NOTE — Telephone Encounter (Signed)
REFERRAL REQUEST Telephone Note  Have you been seen at our office for this problem? na (Advise that they may need an appointment with their PCP before a referral can be done)  Reason for Referral: wife called in and said home health nurse wants him to have ultrasound on his lungs Referral discussed with patient: na Best contact number of patient for referral team: [phone]   Has patient been seen by a specialist for this issue before: no Patient provider preference for referral: na Patient location preference for referral: na   Patient notified that referrals can take up to a week or longer to process. If they haven't heard anything within a week they should call back and speak with the referral department.

## 2020-01-13 ENCOUNTER — Ambulatory Visit (INDEPENDENT_AMBULATORY_CARE_PROVIDER_SITE_OTHER): Payer: Medicare Other

## 2020-01-13 ENCOUNTER — Ambulatory Visit (INDEPENDENT_AMBULATORY_CARE_PROVIDER_SITE_OTHER): Payer: Medicare Other | Admitting: Family

## 2020-01-13 ENCOUNTER — Encounter: Payer: Self-pay | Admitting: Family

## 2020-01-13 DIAGNOSIS — R053 Chronic cough: Secondary | ICD-10-CM

## 2020-01-13 DIAGNOSIS — R059 Cough, unspecified: Secondary | ICD-10-CM | POA: Diagnosis not present

## 2020-01-13 DIAGNOSIS — Z87891 Personal history of nicotine dependence: Secondary | ICD-10-CM

## 2020-01-13 DIAGNOSIS — J439 Emphysema, unspecified: Secondary | ICD-10-CM | POA: Diagnosis not present

## 2020-01-13 NOTE — Progress Notes (Signed)
   Virtual Visit via telephone Note Due to COVID-19 pandemic this visit was conducted virtually. This visit type was conducted due to national recommendations for restrictions regarding the COVID-19 Pandemic (e.g. social distancing, sheltering in place) in an effort to limit this patient's exposure and mitigate transmission in our community. All issues noted in this document were discussed and addressed.  A physical exam was not performed with this format.  I connected with Jonathan Burnett on 01/13/20 at 12:56 pm  by telephone and verified that I am speaking with the correct person using two identifiers. Jonathan Burnett is currently located at home and wife is currently with him  during visit. The provider, [name], FNP is located in their office at time of visit.  I discussed the limitations, risks, security and privacy concerns of performing an evaluation and management service by telephone and the availability of in person appointments. I also discussed with the patient that there may be a patient responsible charge related to this service. The patient expressed understanding and agreed to proceed.   History and Present Illness:    PT calls today with complaints of a nonproductive cough that has been on going for several months to a year. He states he had a wellness nurse come to his house and recommended chest x-ray. He does have a history of smoking, but quit 11-15 years ago.  Cough This is a chronic problem. The current episode started more than 1 month ago. The problem has been waxing and waning. The problem occurs every few minutes. The cough is non-productive. Pertinent negatives include no chills, ear congestion, ear pain, fever, headaches, nasal congestion, shortness of breath or wheezing. He has tried nothing for the symptoms.    Review of Systems  Constitutional: Negative for chills and fever.  HENT: Negative for ear pain.   Respiratory: Positive for cough. Negative for shortness of  breath and wheezing.   Neurological: Negative for headaches.     Observations/Objective: No SOB or distress noted, intermittent dry cough  Assessment and Plan: Jonathan Burnett comes in today with chief complaint of No chief complaint on file.   Diagnosis and orders addressed:  1. Chronic cough - DG Chest 2 View; Future  2. Hx of smoking - DG Chest 2 View; Future  Will do chest x-ray Pt just had lab work 2 months ago, stable Keep F/U with PCP    I discussed the assessment and treatment plan with the patient. The patient was provided an opportunity to ask questions and all were answered. The patient agreed with the plan and demonstrated an understanding of the instructions.   The patient was advised to call back or seek an in-person evaluation if the symptoms worsen or if the condition fails to improve as anticipated.  The above assessment and management plan was discussed with the patient. The patient verbalized understanding of and has agreed to the management plan. Patient is aware to call the clinic if symptoms persist or worsen. Patient is aware when to return to the clinic for a follow-up visit. Patient educated on when it is appropriate to go to the emergency department.   Time call ended:  1:05 pm  I provided 9 minutes of non-face-to-face time during this encounter.    [name], FNP

## 2020-01-16 ENCOUNTER — Other Ambulatory Visit: Payer: Self-pay | Admitting: Family

## 2020-01-16 DIAGNOSIS — J449 Chronic obstructive pulmonary disease, unspecified: Secondary | ICD-10-CM | POA: Insufficient documentation

## 2020-01-16 DIAGNOSIS — J439 Emphysema, unspecified: Secondary | ICD-10-CM

## 2020-01-16 MED ORDER — BUDESONIDE-FORMOTEROL FUMARATE 80-4.5 MCG/ACT IN AERO
2.0000 | INHALATION_SPRAY | Freq: Two times a day (BID) | RESPIRATORY_TRACT | 3 refills | Status: DC
Start: 2020-01-16 — End: 2020-04-19

## 2020-03-15 DIAGNOSIS — R200 Anesthesia of skin: Secondary | ICD-10-CM | POA: Diagnosis not present

## 2020-03-15 DIAGNOSIS — R2689 Other abnormalities of gait and mobility: Secondary | ICD-10-CM | POA: Diagnosis not present

## 2020-03-15 DIAGNOSIS — J0120 Acute ethmoidal sinusitis, unspecified: Secondary | ICD-10-CM | POA: Diagnosis not present

## 2020-03-15 DIAGNOSIS — Z886 Allergy status to analgesic agent status: Secondary | ICD-10-CM | POA: Diagnosis not present

## 2020-03-15 DIAGNOSIS — R269 Unspecified abnormalities of gait and mobility: Secondary | ICD-10-CM | POA: Diagnosis not present

## 2020-03-15 DIAGNOSIS — M25511 Pain in right shoulder: Secondary | ICD-10-CM | POA: Diagnosis not present

## 2020-03-15 DIAGNOSIS — G319 Degenerative disease of nervous system, unspecified: Secondary | ICD-10-CM | POA: Diagnosis not present

## 2020-04-19 ENCOUNTER — Ambulatory Visit (INDEPENDENT_AMBULATORY_CARE_PROVIDER_SITE_OTHER): Payer: Medicare Other | Admitting: Family Medicine

## 2020-04-19 ENCOUNTER — Encounter: Payer: Self-pay | Admitting: Family Medicine

## 2020-04-19 ENCOUNTER — Other Ambulatory Visit: Payer: Self-pay

## 2020-04-19 VITALS — BP 126/86 | HR 84 | Ht 68.5 in | Wt 146.0 lb

## 2020-04-19 DIAGNOSIS — J439 Emphysema, unspecified: Secondary | ICD-10-CM | POA: Diagnosis not present

## 2020-04-19 DIAGNOSIS — R269 Unspecified abnormalities of gait and mobility: Secondary | ICD-10-CM | POA: Diagnosis not present

## 2020-04-19 DIAGNOSIS — K219 Gastro-esophageal reflux disease without esophagitis: Secondary | ICD-10-CM

## 2020-04-19 DIAGNOSIS — E782 Mixed hyperlipidemia: Secondary | ICD-10-CM | POA: Diagnosis not present

## 2020-04-19 MED ORDER — BUDESONIDE-FORMOTEROL FUMARATE 80-4.5 MCG/ACT IN AERO: 2.0000 | INHALATION_SPRAY | Freq: Two times a day (BID) | RESPIRATORY_TRACT | 3 refills | Status: DC

## 2020-04-19 MED ORDER — SILDENAFIL CITRATE 20 MG PO TABS: 20.0000 mg | ORAL_TABLET | ORAL | 1 refills | Status: DC | PRN

## 2020-04-19 NOTE — Progress Notes (Signed)
BP 126/86   Pulse 84   Ht 5' 8.5" (1.74 m)   Wt 146 lb (66.2 kg)   SpO2 97%   BMI 21.88 kg/m    Subjective:   Patient ID: Jonathan Burnett, male    DOB: [birth year], 70 y.o.   MRN: [id number]  HPI: Jonathan Burnett is a 70 y.o. male presenting on 04/19/2020 for Medical Management of Chronic Issues and Hyperlipidemia   HPI Hyperlipidemia Patient is coming in for recheck of his hyperlipidemia. The patient is currently taking no medication and has been diet controlled. They deny any issues with myalgias or history of liver damage from it. They deny any focal numbness or weakness or chest pain.   GERD Patient is currently on no medication.  She denies any major symptoms or abdominal pain or belching or burping. She denies any blood in her stool or lightheadedness or dizziness.   COPD Patient is coming in for COPD recheck today.  He is currently on symbicort.  He has a mild chronic cough but denies any major coughing spells or wheezing spells.  He has 0nighttime symptoms per week and 0daytime symptoms per week currently.   Relevant past medical, surgical, family and social history reviewed and updated as indicated. Interim medical history since our last visit reviewed. Allergies and medications reviewed and updated.  Review of Systems  Constitutional: Negative for chills and fever.  Eyes: Negative for visual disturbance.  Respiratory: Negative for shortness of breath and wheezing.   Cardiovascular: Negative for chest pain and leg swelling.  Musculoskeletal: Negative for back pain and gait problem.  Skin: Negative for rash.  Neurological: Positive for weakness. Negative for dizziness and numbness.  All other systems reviewed and are negative.   Per HPI unless specifically indicated above   Allergies as of 04/19/2020      Reactions   Aspirin    GI upset/nausea      Medication List       Accurate as of April 19, 2020  3:12 PM. If you have any questions, ask your nurse or doctor.         STOP taking these medications   acetaminophen 500 MG tablet Commonly known as: TYLENOL Stopped by: Fransisca Kaufmann Nasiyah Laverdiere, MD   icosapent Ethyl 1 g capsule Commonly known as: Vascepa Stopped by: Worthy Rancher, MD   MURO 128 OP Stopped by: Fransisca Kaufmann Ota Ebersole, MD   prednisoLONE acetate 1 % ophthalmic suspension Commonly known as: PRED FORTE Stopped by: Fransisca Kaufmann Reida Hem, MD     TAKE these medications   budesonide-formoterol 80-4.5 MCG/ACT inhaler Commonly known as: SYMBICORT Inhale 2 puffs into the lungs 2 (two) times daily.   cetirizine 10 MG tablet Commonly known as: ZYRTEC Take 10 mg by mouth daily as needed for allergies.   sildenafil 20 MG tablet Commonly known as: REVATIO Take 1-3 tablets (20-60 mg total) by mouth as needed. What changed: reasons to take this        Objective:   BP 126/86   Pulse 84   Ht 5' 8.5" (1.74 m)   Wt 146 lb (66.2 kg)   SpO2 97%   BMI 21.88 kg/m   Wt Readings from Last 3 Encounters:  04/19/20 146 lb (66.2 kg)  10/24/19 155 lb (70.3 kg)  10/14/19 145 lb (65.8 kg)    Physical Exam Vitals and nursing note reviewed.  Constitutional:      General: He is not in acute distress.    Appearance: He is well-developed  and well-nourished. He is not diaphoretic.  Eyes:     General: No scleral icterus.    Extraocular Movements: EOM normal.     Conjunctiva/sclera: Conjunctivae normal.  Neck:     Thyroid: No thyromegaly.  Cardiovascular:     Rate and Rhythm: Normal rate and regular rhythm.     Pulses: Intact distal pulses.     Heart sounds: Normal heart sounds. No murmur heard.   Pulmonary:     Effort: Pulmonary effort is normal. No respiratory distress.     Breath sounds: Normal breath sounds. No wheezing.  Musculoskeletal:        General: No edema.     Cervical back: Neck supple.     Comments: Gait shows lateral hip flexor weakness  Lymphadenopathy:     Cervical: No cervical adenopathy.  Skin:    General: Skin is warm  and dry.     Findings: No rash.  Neurological:     Mental Status: He is alert and oriented to person, place, and time.     Coordination: Coordination normal.  Psychiatric:        Mood and Affect: Mood and affect normal.        Behavior: Behavior normal.       Assessment & Plan:   Problem List Items Addressed This Visit      Respiratory   COPD (chronic obstructive pulmonary disease) (HCC)   Relevant Medications   budesonide-formoterol (SYMBICORT) 80-4.5 MCG/ACT inhaler   Other Relevant Orders   CBC with Differential/Platelet   Lipid panel     Digestive   GERD (gastroesophageal reflux disease)   Relevant Orders   CBC with Differential/Platelet     Other   HLD (hyperlipidemia) - Primary   Relevant Orders   CMP14+EGFR   Lipid panel    Other Visit Diagnoses    Gait disorder       Relevant Orders   Ambulatory referral to Physical Therapy      Continue current medication called because of weakness in his gait and having troubles with his balance, will send him over to physical therapy to do some strengthening exercises. Follow up plan: Return in about 6 months (around 10/17/2020), or if symptoms worsen or fail to improve, for COPD and hyperlipidemia.  Counseling provided for all of the vaccine components No orders of the defined types were placed in this encounter.   Caryl Pina, MD [facility] 04/19/2020, 3:12 PM

## 2020-04-20 LAB — CMP14+EGFR
ALT: 11 IU/L (ref 0–44)
AST: 16 IU/L (ref 0–40)
Albumin/Globulin Ratio: 1.5 (ref 1.2–2.2)
Albumin: 4.3 g/dL (ref 3.8–4.8)
Alkaline Phosphatase: 88 IU/L (ref 44–121)
BUN/Creatinine Ratio: 13 (ref 10–24)
BUN: 13 mg/dL (ref 8–27)
Bilirubin Total: 0.4 mg/dL (ref 0.0–1.2)
CO2: 22 mmol/L (ref 20–29)
Calcium: 9.6 mg/dL (ref 8.6–10.2)
Chloride: 105 mmol/L (ref 96–106)
Creatinine, Ser: 1.01 mg/dL (ref 0.76–1.27)
GFR calc Af Amer: 87 mL/min/{1.73_m2} (ref >59)
GFR calc non Af Amer: 75 mL/min/{1.73_m2} (ref >59)
Globulin, Total: 2.9 g/dL (ref 1.5–4.5)
Glucose: 89 mg/dL (ref 65–99)
Potassium: 4 mmol/L (ref 3.5–5.2)
Sodium: 140 mmol/L (ref 134–144)
Total Protein: 7.2 g/dL (ref 6.0–8.5)

## 2020-04-20 LAB — CBC WITH DIFFERENTIAL/PLATELET
Basophils Absolute: 0.1 10*3/uL (ref 0.0–0.2)
Basos: 1 %
EOS (ABSOLUTE): 0.3 10*3/uL (ref 0.0–0.4)
Eos: 4 %
Hematocrit: 43.3 % (ref 37.5–51.0)
Hemoglobin: 14.7 g/dL (ref 13.0–17.7)
Immature Grans (Abs): 0 10*3/uL (ref 0.0–0.1)
Immature Granulocytes: 0 %
Lymphocytes Absolute: 2.7 10*3/uL (ref 0.7–3.1)
Lymphs: 30 %
MCH: 31.4 pg (ref 26.6–33.0)
MCHC: 33.9 g/dL (ref 31.5–35.7)
MCV: 93 fL (ref 79–97)
Monocytes Absolute: 0.7 10*3/uL (ref 0.1–0.9)
Monocytes: 7 %
Neutrophils Absolute: 5.2 10*3/uL (ref 1.4–7.0)
Neutrophils: 58 %
Platelets: 216 10*3/uL (ref 150–450)
RBC: 4.68 x10E6/uL (ref 4.14–5.80)
RDW: 13 % (ref 11.6–15.4)
WBC: 9 10*3/uL (ref 3.4–10.8)

## 2020-04-20 LAB — LIPID PANEL
Chol/HDL Ratio: 3.5 ratio (ref 0.0–5.0)
Cholesterol, Total: 149 mg/dL (ref 100–199)
HDL: 42 mg/dL (ref >39)
LDL Chol Calc (NIH): 87 mg/dL (ref 0–99)
Triglycerides: 111 mg/dL (ref 0–149)
VLDL Cholesterol Cal: 20 mg/dL (ref 5–40)

## 2020-05-07 ENCOUNTER — Ambulatory Visit (INDEPENDENT_AMBULATORY_CARE_PROVIDER_SITE_OTHER): Payer: Medicare Other | Admitting: *Deleted

## 2020-05-07 DIAGNOSIS — Z0000 Encounter for general adult medical examination without abnormal findings: Secondary | ICD-10-CM | POA: Diagnosis not present

## 2020-05-07 NOTE — Progress Notes (Signed)
MEDICARE ANNUAL WELLNESS VISIT  05/07/2020  Telephone Visit Disclaimer This Medicare AWV was conducted by telephone due to national recommendations for restrictions regarding the COVID-19 Pandemic (e.g. social distancing).  I verified, using two identifiers, that I am speaking with [name] or their authorized healthcare agent. I discussed the limitations, risks, security, and privacy concerns of performing an evaluation and management service by telephone and the potential availability of an in-person appointment in the future. The patient expressed understanding and agreed to proceed.  Location of Patient: Home Location of Provider (nurse):  [facility]  Subjective:    Jonathan Burnett is a 70 y.o. male patient of Dettinger, Elige Radon, MD who had a Medicare Annual Wellness Visit today via telephone. [name] is retired and lives with his wife Jonathan Burnett and son. They have 4 sons. He reports that he is socially active and does interact with friends/family regularly. He is minimally physically active and enjoys the outdoors.  Patient Care Team: Dettinger, Elige Radon, MD as PCP - General (Family Medicine) Drema Dallas, DO as Consulting Physician (Neurology)  Advanced Directives 05/07/2020 10/24/2019 02/28/2019 11/29/2018 07/17/2018 06/17/2018 07/28/2017  Does Patient Have a Medical Advance Directive? Yes No No No No No No  Type of Estate agent of [place];Living will - - - - - -  Copy of Healthcare Power of Attorney in Chart? Yes - validated most recent copy scanned in chart (See row information) - - - - - -  Would patient like information on creating a medical advance directive? - No - Patient declined No - Patient declined Yes (MAU/Ambulatory/Procedural Areas - Information given) No - Guardian declined No - Patient declined No - Patient declined    Hospital Utilization Over the Past 12 Months: # of hospitalizations or ER visits: 0 # of surgeries: 0  Review of Systems    Patient  reports that his overall health is unchanged compared to last year.  History obtained from the patient and patient chart.  Patient Reported Readings (BP, Pulse, CBG, Weight, etc) none  Pain Assessment Pain : No/denies pain     Current Medications & Allergies (verified) Allergies as of 05/07/2020      Reactions   Aspirin    GI upset/nausea      Medication List       Accurate as of May 07, 2020  2:02 PM. If you have any questions, ask your nurse or doctor.        budesonide-formoterol 80-4.5 MCG/ACT inhaler Commonly known as: SYMBICORT Inhale 2 puffs into the lungs 2 (two) times daily.   cetirizine 10 MG tablet Commonly known as: ZYRTEC Take 10 mg by mouth daily as needed for allergies.       History (reviewed): Past Medical History:  Diagnosis Date  . Anemia   . Blood transfusion without reported diagnosis    with appendix rupture   . Colon polyp   . Diverticulosis   . ED (erectile dysfunction)   . GERD (gastroesophageal reflux disease)    diet controlled  . Hyperlipidemia   . Hypoglycemia   . Wears dentures full  . Wears glasses    Past Surgical History:  Procedure Laterality Date  . APPENDECTOMY    . CHOLECYSTECTOMY    . COLONOSCOPY    . COLONOSCOPY WITH PROPOFOL N/A 02/28/2019   Procedure: COLONOSCOPY WITH PROPOFOL;  Surgeon: Meridee Score Netty Starring., MD;  Location: [facility] END[facility]COPY;  Service: Gastroenterology;  Laterality: N/A;  . COLONOSCOPY WITH PROPOFOL N/A 10/24/2019  Procedure: COLONOSCOPY WITH PROPOFOL;  Surgeon: Mansouraty, Telford Nab., MD;  Location: [place];  Service: Gastroenterology;  Laterality: N/A;  . ENDOSCOPIC MUCOSAL RESECTION N/A 02/28/2019   Procedure: ENDOSCOPIC MUCOSAL RESECTION;  Surgeon: Rush Landmark Telford Nab., MD;  Location: [place];  Service: Gastroenterology;  Laterality: N/A;  . HEMOSTASIS CLIP PLACEMENT  02/28/2019   Procedure: HEMOSTASIS CLIP PLACEMENT;  Surgeon: Irving Copas., MD;  Location: [place];  Service: Gastroenterology;;  . POLYPECTOMY    . POLYPECTOMY  10/24/2019   Procedure: POLYPECTOMY;  Surgeon: Mansouraty, Telford Nab., MD;  Location: [place];  Service: Gastroenterology;;  . Lia Foyer LIFTING INJECTION  02/28/2019   Procedure: SUBMUCOSAL LIFTING INJECTION;  Surgeon: Irving Copas., MD;  Location[facility];  Service: Gastroenterology;;   Family History  Problem Relation Age of Onset  . Hypertension Father   . Lung cancer Father   . Stomach cancer Brother   . Healthy Sister   . Healthy Daughter   . Healthy Brother   . Healthy Brother   . Healthy Brother   . Healthy Sister   . Healthy Daughter   . Colon polyps Neg Hx   . Colon cancer Neg Hx   . Esophageal cancer Neg Hx   . Rectal cancer Neg Hx   . Inflammatory bowel disease Neg Hx   . Liver disease Neg Hx   . Pancreatic cancer Neg Hx    Social History   Socioeconomic History  . Marital status: Married    Spouse name: Inez Catalina  . Number of children: 4  . Years of education: 10  . Highest education level: 9th grade  Occupational History  . Occupation: English as a second language teacher: [employer]    Comment: Retired  Tobacco Use  . Smoking status: Never Smoker  . Smokeless tobacco: Never Used  Vaping Use  . Vaping Use: Never used  Substance and Sexual Activity  . Alcohol use: No  . Drug use: No  . Sexual activity: Yes  Other Topics Concern  . Not on file  Social History Narrative   Patient is right-handed. He lives with his wife in a two level home, Restaurant manager, fast food on the first floor.      Enjoys being outdoors. Has one pet dog.    Social Determinants of Health   Financial Resource Strain: Not on file  Food Insecurity: Not on file  Transportation Needs: Not on file  Physical Activity: Not on file  Stress: Not on file  Social Connections: Not on file    Activities of Daily Living In your present state of health, do you have any difficulty performing the following activities:  05/07/2020  Hearing? N  Vision? N  Difficulty concentrating or making decisions? Y  Walking or climbing stairs? N  Dressing or bathing? N  Doing errands, shopping? N  Preparing Food and eating ? N  Using the Toilet? N  In the past six months, have you accidently leaked urine? N  Do you have problems with loss of bowel control? N  Managing your Medications? N  Managing your Finances? N  Housekeeping or managing your Housekeeping? N  Some recent data might be hidden    Patient Education/ Literacy How often do you need to have someone help you when you read instructions, pamphlets, or other written materials from your doctor or pharmacy?: 3 - Sometimes What is the last grade level you completed in school?: 10th  Exercise Current Exercise Habits: The patient does not participate in regular exercise at  present, Exercise limited by: None identified  Diet Patient reports consuming 2 meals a day and 2 snack(s) a day Patient reports that his primary diet is: Regular Patient reports that she does have regular access to food.   Depression Screen PHQ 2/9 Scores 05/07/2020 04/19/2020 10/13/2019 11/29/2018 05/05/2018 11/19/2017 07/29/2017  PHQ - 2 Score 0 0 0 0 0 0 0     Fall Risk Fall Risk  05/07/2020 04/19/2020 10/13/2019 11/29/2018 05/28/2018  Falls in the past year? 0 0 0 0 0  Number falls in past yr: - - - 0 -  Injury with Fall? - - - 0 -  Follow up - - - - Falls evaluation completed     Objective:  Brandan Robicheaux seemed alert and oriented and he participated appropriately during our telephone visit.  Blood Pressure Weight BMI  BP Readings from Last 3 Encounters:  04/19/20 126/86  10/24/19 120/84  10/13/19 120/78   Wt Readings from Last 3 Encounters:  04/19/20 146 lb (66.2 kg)  10/24/19 155 lb (70.3 kg)  10/14/19 145 lb (65.8 kg)   BMI Readings from Last 1 Encounters:  04/19/20 21.88 kg/m    *Unable to obtain current vital signs, weight, and BMI due to telephone visit  type  Hearing/Vision  . Emily did not seem to have difficulty with hearing/understanding during the telephone conversation . Reports that he has not had a formal eye exam by an eye care professional within the past year . Reports that he has not had a formal hearing evaluation within the past year *Unable to fully assess hearing and vision during telephone visit type  Cognitive Function: 6CIT Screen 05/07/2020 11/29/2018  What Year? 4 points 0 points  What month? 0 points 0 points  What time? 0 points 0 points  Count back from 20 2 points 0 points  Months in reverse 4 points 0 points  Repeat phrase 4 points 0 points  Total Score 14 0   (Normal:0-7, Significant for Dysfunction: >8)  Normal Cognitive Function Screening: No: 14   Immunization & Health Maintenance Record Immunization History  Administered Date(s) Administered  . Fluad Quad(high Dose 65+) 12/22/2018  . Influenza, High Dose Seasonal PF 01/06/2017, 01/06/2018  . Influenza-Unspecified 02/24/2020  . Moderna Sars-Covid-2 Vaccination 09/06/2019, 10/03/2019  . Pneumococcal Conjugate-13 10/21/2018  . Pneumococcal Polysaccharide-23 05/21/2017  . Tdap 01/06/2018    Health Maintenance  Topic Date Due  . Hepatitis C Screening  10/12/2020 (Originally [birth year])  . COVID-19 Vaccine (3 - Booster for Moderna series) 10/22/2020 (Originally 04/03/2020)  . TETANUS/TDAP  01/07/2028  . COLONOSCOPY (Pts 45-74yrs Insurance coverage will need to be confirmed)  10/23/2029  . INFLUENZA VACCINE  Completed  . PNA vac Low Risk Adult  Completed       Assessment  This is a routine wellness examination for Jonathan Burnett.  Health Maintenance: Due or Overdue There are no preventive care reminders to display for this patient.  Zachery Dakins does not need a referral for Community Assistance: Care Management:   no Social Work:    no Prescription Assistance:  no Nutrition/Diabetes Education:  no   Plan:  Personalized Goals Goals  Addressed            This Visit's Progress   . Patient Stated       05/07/2020 AWV Goal: Exercise for General Health   Patient will verbalize understanding of the benefits of increased physical activity:  Exercising regularly is important. It will improve your overall fitness, flexibility, and  endurance.  Regular exercise also will improve your overall health. It can help you control your weight, reduce stress, and improve your bone density.  Over the next year, patient will increase physical activity as tolerated with a goal of at least 150 minutes of moderate physical activity per week.   You can tell that you are exercising at a moderate intensity if your heart starts beating faster and you start breathing faster but can still hold a conversation.  Moderate-intensity exercise ideas include:  Walking 1 mile (1.6 km) in about 15 minutes  Biking  Hiking  Golfing  Dancing  Water aerobics  Patient will verbalize understanding of everyday activities that increase physical activity by providing examples like the following: ? Yard work, such as: ? Pushing a Surveyor, mining ? Raking and bagging leaves ? Washing your car ? Pushing a stroller ? Shoveling snow ? Gardening ? Washing windows or floors  Patient will be able to explain general safety guidelines for exercising:   Before you start a new exercise program, talk with your health care provider.  Do not exercise so much that you hurt yourself, feel dizzy, or get very short of breath.  Wear comfortable clothes and wear shoes with good support.  Drink plenty of water while you exercise to prevent dehydration or heat stroke.  Work out until your breathing and your heartbeat get faster.       Personalized Health Maintenance & Screening Recommendations    Lung Cancer Screening Recommended: no (Low Dose CT Chest recommended if Age 29-80 years, 30 pack-year currently smoking OR have quit w/in past 15 years) Hepatitis C  Screening recommended: no HIV Screening recommended: no  Advanced Directives: Written information was not prepared per patient's request.  Referrals & Orders No orders of the defined types were placed in this encounter.   Follow-up Plan . Follow-up with Dettinger, Elige Radon, MD as planned .    I have personally reviewed and noted the following in the patient's chart:   . Medical and social history . Use of alcohol, tobacco or illicit drugs  . Current medications and supplements . Functional ability and status . Nutritional status . Physical activity . Advanced directives . List of other physicians . Hospitalizations, surgeries, and ER visits in previous 12 months . Vitals . Screenings to include cognitive, depression, and falls . Referrals and appointments  In addition, I have reviewed and discussed with [name] certain preventive protocols, quality metrics, and best practice recommendations. A written personalized care plan for preventive services as well as general preventive health recommendations is available and can be mailed to the patient at his request.      Adella Hare, LPN  05/28/863

## 2020-06-26 ENCOUNTER — Ambulatory Visit (INDEPENDENT_AMBULATORY_CARE_PROVIDER_SITE_OTHER): Payer: Medicare Other | Admitting: Family Medicine

## 2020-06-26 ENCOUNTER — Encounter: Payer: Self-pay | Admitting: Family Medicine

## 2020-06-26 ENCOUNTER — Other Ambulatory Visit: Payer: Self-pay

## 2020-06-26 VITALS — BP 119/66 | HR 72 | Temp 97.6°F | Ht 68.5 in | Wt 148.0 lb

## 2020-06-26 DIAGNOSIS — R2689 Other abnormalities of gait and mobility: Secondary | ICD-10-CM

## 2020-06-26 DIAGNOSIS — M5431 Sciatica, right side: Secondary | ICD-10-CM | POA: Diagnosis not present

## 2020-06-26 MED ORDER — BETAMETHASONE SOD PHOS & ACET 6 (3-3) MG/ML IJ SUSP
6.0000 mg | Freq: Once | INTRAMUSCULAR | Status: AC
  Administered 2020-06-26: 6 mg via INTRAMUSCULAR

## 2020-06-26 NOTE — Progress Notes (Signed)
Subjective:  Patient ID: Jylan Loeza, male    DOB: May 08, 1950  Age: 70 y.o. MRN: [id number]  CC: Fall   HPI Giovoni Bunch presents for pain in the right side that shoots up the back and down the right leg. Onset was one week ago. He has had a lot of trouble with balance and falling. This new set of symptoms has made it much worse. He has no focal weakness. Thee is no numbness. Loss of strength is nonfocal.  Depression screen [facility] 2/9 06/26/2020 05/07/2020 04/19/2020  Decreased Interest 0 0 0  Down, Depressed, Hopeless 0 0 0  PHQ - 2 Score 0 0 0    History Azreal has a past medical history of Anemia, Blood transfusion without reported diagnosis, Colon polyp, Diverticulosis, ED (erectile dysfunction), GERD (gastroesophageal reflux disease), Hyperlipidemia, Hypoglycemia, Wears dentures (full), and Wears glasses.   He has a past surgical history that includes Appendectomy; Colonoscopy; Cholecystectomy; Colonoscopy with propofol (N/A, 02/28/2019); Endoscopic mucosal resection (N/A, 02/28/2019); Submucosal lifting injection (02/28/2019); Hemostasis clip placement (02/28/2019); Polypectomy; Colonoscopy with propofol (N/A, 10/24/2019); and polypectomy (10/24/2019).   His family history includes Healthy in his brother, brother, brother, daughter, daughter, sister, and sister; Hypertension in his father; Lung cancer in his father; Stomach cancer in his brother.He reports that he has never smoked. He has never used smokeless tobacco. He reports that he does not drink alcohol and does not use drugs.    ROS Review of Systems  Constitutional: Negative for fever.  Respiratory: Negative for shortness of breath.   Cardiovascular: Negative for chest pain.  Musculoskeletal: Negative for arthralgias.  Skin: Negative for rash.    Objective:  BP 119/66   Pulse 72   Temp 97.6 F (36.4 C)   Ht 5' 8.5" (1.74 m)   Wt 148 lb (67.1 kg)   SpO2 97%   BMI 22.18 kg/m   BP Readings from Last 3 Encounters:   06/26/20 119/66  04/19/20 126/86  10/24/19 120/84    Wt Readings from Last 3 Encounters:  06/26/20 148 lb (67.1 kg)  04/19/20 146 lb (66.2 kg)  10/24/19 155 lb (70.3 kg)     Physical Exam Vitals reviewed.  Constitutional:      Appearance: He is well-developed. He is ill-appearing (thin, frail).  HENT:     Head: Normocephalic and atraumatic.     Right Ear: Tympanic membrane and external ear normal. No decreased hearing noted.     Left Ear: Tympanic membrane and external ear normal. No decreased hearing noted.     Mouth/Throat:     Pharynx: No oropharyngeal exudate or posterior oropharyngeal erythema.  Eyes:     Pupils: Pupils are equal, round, and reactive to light.  Cardiovascular:     Rate and Rhythm: Normal rate and regular rhythm.     Heart sounds: No murmur heard.   Pulmonary:     Effort: No respiratory distress.     Breath sounds: Normal breath sounds.  Abdominal:     General: Bowel sounds are normal.     Palpations: Abdomen is soft. There is no mass.     Tenderness: There is no abdominal tenderness.  Musculoskeletal:     Cervical back: Normal range of motion and neck supple.  Neurological:     General: No focal deficit present.     Mental Status: He is oriented to person, place, and time.       Assessment & Plan:   Laden was seen today for fall.  Diagnoses and  all orders for this visit:  Imbalance -     Ambulatory referral to Physical Therapy  Sciatica of right side -     betamethasone acetate-betamethasone sodium phosphate (CELESTONE) injection 6 mg       I am having Zachery Dakins maintain his cetirizine and budesonide-formoterol. We administered betamethasone acetate-betamethasone sodium phosphate.  Allergies as of 06/26/2020      Reactions   Aspirin    GI upset/nausea      Medication List       Accurate as of June 26, 2020 11:59 PM. If you have any questions, ask your nurse or doctor.        budesonide-formoterol 80-4.5 MCG/ACT  inhaler Commonly known as: SYMBICORT Inhale 2 puffs into the lungs 2 (two) times daily.   cetirizine 10 MG tablet Commonly known as: ZYRTEC Take 10 mg by mouth daily as needed for allergies.        Follow-up: Return if symptoms worsen or fail to improve.  Claretta Fraise, M.D.

## 2020-06-27 ENCOUNTER — Encounter: Payer: Self-pay | Admitting: Family Medicine

## 2020-07-05 ENCOUNTER — Encounter: Payer: Self-pay | Admitting: Physical Therapy

## 2020-07-05 ENCOUNTER — Ambulatory Visit: Payer: Medicare Other | Attending: Family Medicine | Admitting: Physical Therapy

## 2020-07-05 ENCOUNTER — Other Ambulatory Visit: Payer: Self-pay

## 2020-07-05 DIAGNOSIS — R2681 Unsteadiness on feet: Secondary | ICD-10-CM | POA: Insufficient documentation

## 2020-07-05 DIAGNOSIS — M6281 Muscle weakness (generalized): Secondary | ICD-10-CM | POA: Diagnosis not present

## 2020-07-05 NOTE — Therapy (Signed)
[facility] [place], [place], [address] Phone: [phone]   Fax:  [phone]  Physical Therapy Evaluation  Patient Details  Name: Jonathan Burnett MRN: [id number] Date of Birth: Feb 06, 1951 Referring Provider (PT): Claretta Fraise, MD   Encounter Date: 07/05/2020   PT End of Session - 07/05/20 0935    Visit Number 1    Number of Visits 12    Date for PT Re-Evaluation 08/23/20    Authorization Type [employer]; Progress note every 10th visit    PT Start Time 0900    PT Stop Time 0943    PT Time Calculation (min) 43 min    Activity Tolerance Patient tolerated treatment well    Behavior During Therapy [facility] for tasks assessed/performed           Past Medical History:  Diagnosis Date  . Anemia   . Blood transfusion without reported diagnosis    with appendix rupture   . Colon polyp   . Diverticulosis   . ED (erectile dysfunction)   . GERD (gastroesophageal reflux disease)    diet controlled  . Hyperlipidemia   . Hypoglycemia   . Wears dentures full  . Wears glasses     Past Surgical History:  Procedure Laterality Date  . APPENDECTOMY    . CHOLECYSTECTOMY    . COLONOSCOPY    . COLONOSCOPY WITH PROPOFOL N/A 02/28/2019   Procedure: COLONOSCOPY WITH PROPOFOL;  Surgeon: Rush Landmark Telford Nab., MD;  Location: [place];  Service: Gastroenterology;  Laterality: N/A;  . COLONOSCOPY WITH PROPOFOL N/A 10/24/2019   Procedure: COLONOSCOPY WITH PROPOFOL;  Surgeon: Rush Landmark Telford Nab., MD;  Location: [place];  Service: Gastroenterology;  Laterality: N/A;  . ENDOSCOPIC MUCOSAL RESECTION N/A 02/28/2019   Procedure: ENDOSCOPIC MUCOSAL RESECTION;  Surgeon: Rush Landmark Telford Nab., MD;  Location: [place];  Service: Gastroenterology;  Laterality: N/A;  . HEMOSTASIS CLIP PLACEMENT  02/28/2019   Procedure: HEMOSTASIS CLIP PLACEMENT;  Surgeon: Irving Copas., MD;  Location: [place];  Service:  Gastroenterology;;  . POLYPECTOMY    . POLYPECTOMY  10/24/2019   Procedure: POLYPECTOMY;  Surgeon: Mansouraty, Telford Nab., MD;  Location: [place];  Service: Gastroenterology;;  . Lia Foyer LIFTING INJECTION  02/28/2019   Procedure: SUBMUCOSAL LIFTING INJECTION;  Surgeon: Irving Copas., MD;  Location: [place];  Service: Gastroenterology;;    There were no vitals filed for this visit.    Subjective Assessment - 07/05/20 1256    Subjective COVID-19 screening performed upon arrival. Patient arrives to physical therapy with reports of loss of balance and weakness R>L that began about 3-4 years ago. Patient reports 2 falls in the last 6 months in which he was able to come to standing on his own. Patient reports right rib pain after his most recent fall about 1 week ago. Patient is able to peform all ADLs and home activities independently. Patient's goals are to decrease pain, improve balance, and stop falls.    Pertinent History Fall risk,, GERD    Limitations House hold activities;Standing;Walking    Patient Stated Goals stop falls    Currently in Pain? Yes    Pain Score 6     Pain Location Rib cage    Pain Orientation Right    Pain Descriptors / Indicators Sore    Pain Type Acute pain    Pain Onset 1 to 4 weeks ago    Pain Frequency Intermittent  [facility] PT Assessment - 07/05/20 0001      Assessment   Medical Diagnosis imbalance    Referring Provider (PT) Claretta Fraise, MD    Onset Date/Surgical Date --   3-4 years   Hand Dominance Right    Next MD Visit July 2022    Prior Therapy no      Precautions   Precautions Fall      Restrictions   Weight Bearing Restrictions No      Balance Screen   Has the patient fallen in the past 6 months Yes    How many times? 2    Has the patient had a decrease in activity level because of a fear of falling?  No    Is the patient reluctant to leave their home because of a fear of falling?  No      Home  Environment   Living Environment Private residence    Living Arrangements Spouse/significant other    Type of [place] to enter    Entrance Stairs-Number of Steps 4    Entrance Stairs-Rails None      Prior Function   Level of Independence Independent      ROM / Strength   AROM / PROM / Strength Strength      Strength   Strength Assessment Site Knee;Hip    Right/Left Hip Right;Left    Right Hip Flexion 4-/5    Right Hip Extension 3+/5    Right Hip ABduction 3+/5    Left Hip Flexion 4-/5    Left Hip Extension 3+/5    Left Hip ABduction 3+/5    Right/Left Knee Right;Left    Right Knee Flexion 4+/5    Right Knee Extension 4+/5    Left Knee Flexion 4+/5    Left Knee Extension 4+/5      Transfers   Transfers Independent with all Transfers    Five time sit to stand comments  14.88 seconds no UE support      Ambulation/Gait   Gait Pattern Step-through pattern;Wide base of support;Decreased stride length      Balance   Balance Assessed Yes      Standardized Balance Assessment   Standardized Balance Assessment Berg Balance Test      Berg Balance Test   Sit to Stand Able to stand without using hands and stabilize independently    Standing Unsupported Able to stand safely 2 minutes    Sitting with Back Unsupported but Feet Supported on Floor or Stool Able to sit safely and securely 2 minutes    Stand to Sit Sits safely with minimal use of hands    Transfers Able to transfer safely, minor use of hands    Standing Unsupported with Eyes Closed Able to stand 10 seconds safely    Standing Unsupported with Feet Together Able to place feet together independently and stand 1 minute safely    From Standing, Reach Forward with Outstretched Arm Can reach confidently >25 cm (10")    From Standing Position, Pick up Object from Floor Able to pick up shoe safely and easily    From Standing Position, Turn to Look Behind Over each Shoulder Looks behind from both sides  and weight shifts well    Turn 360 Degrees Able to turn 360 degrees safely one side only in 4 seconds or less    Standing Unsupported, Alternately Place Feet on Step/Stool Able to stand independently and safely and complete 8 steps in  20 seconds    Standing Unsupported, One Foot in [place] to place foot tandem independently and hold 30 seconds    Standing on One Leg Able to lift leg independently and hold > 10 seconds    Total Score 55                      Objective measurements completed on examination: See above findings.       [facility] Adult PT Treatment/Exercise - 07/05/20 0001      Exercises   Exercises Knee/Hip      Knee/Hip Exercises: Aerobic   Nustep Level 6 x12 mins                  PT Education - 07/05/20 1532    Education Details heel raises, toe raises, seated marching, clamshells    Person(s) Educated Patient    Methods Explanation;Demonstration;Handout    Comprehension Verbalized understanding               PT Long Term Goals - 07/05/20 1125      PT LONG TERM GOAL #1   Title Patient will be independent with HEP    Time 6    Period Weeks    Status New      PT LONG TERM GOAL #2   Title Patient will perform 5x sit to stand in 12 seconds or less to decrease fall risk.    Time 6    Period Weeks    Status New      PT LONG TERM GOAL #3   Title Patient will decrease risk of falls as noted by 56/56 on Berg Balance Scale    Time 6    Period Weeks    Status New                  Plan - 07/05/20 1113    Clinical Impression Statement Patient is a 70 year old male who presents to physical therapy with decreased balance and decreased bilateral LE MMT that began about 3-4 years ago. Patient's 5x sit to stand test of 16 seconds categorizes him as a fall risk with decreased bilateral functional strength. Patient's Berg Balance Scale categorizes him as a a low fall risk. Patient and PT discussed POC and HEP to which patient reported  understanding. Patient would benefit from skilled physical therapy to address deficits and patient's goals.    Personal Factors and Comorbidities Age;Time since onset of injury/illness/exacerbation    Examination-Activity Limitations Locomotion Level;Transfers    Examination-Participation Restrictions Yard Work    Stability/Clinical Decision Making Stable/Uncomplicated    Clinical Decision Making Low    Rehab Potential Good    PT Frequency 2x / week    PT Duration 6 weeks    PT Treatment/Interventions ADLs/Self Care Home Management;Gait training;Stair training;Functional mobility training;Therapeutic activities;Therapeutic exercise;Balance training;Neuromuscular re-education;Manual techniques;Passive range of motion;Patient/family education    PT Next Visit Plan Nustep, balance activities, LE stregthening and stability, core strengthening    PT Home Exercise Plan see patient education section    Consulted and Agree with Plan of Care Patient           Patient will benefit from skilled therapeutic intervention in order to improve the following deficits and impairments:  Decreased activity tolerance,Decreased balance,Postural dysfunction,Decreased strength,Difficulty walking,Abnormal gait  Visit Diagnosis: Unsteadiness on feet - Plan: PT plan of care cert/re-cert  Muscle weakness (generalized) - Plan: PT plan of care cert/re-cert     Problem List  Patient Active Problem List   Diagnosis Date Noted  . COPD (chronic obstructive pulmonary disease) ([place]) 01/16/2020  . Erectile dysfunction 10/13/2019  . Abnormal colonoscopy 01/18/2019  . Tubular adenoma of colon 01/18/2019  . History of colonic polyps 01/18/2019  . HLD (hyperlipidemia) 09/03/2016  . Keratitis, herpetic 07/11/2014  . Herpes simplex iridocyclitis 08/12/2011  . Intermediate uveitis 02/11/2011  . GERD (gastroesophageal reflux disease) 11/24/2008    Gabriela Eves, PT, DPT 07/05/2020, 4:53 PM  [facility] [address] [place], [place], [address] Phone: [phone]   Fax:  [phone]  Name: Jonathan Burnett MRN: [id number] Date of Birth: 27-Dec-1950

## 2020-07-12 ENCOUNTER — Other Ambulatory Visit: Payer: Self-pay

## 2020-07-12 ENCOUNTER — Ambulatory Visit: Payer: Medicare Other | Admitting: Physical Therapy

## 2020-07-12 DIAGNOSIS — M6281 Muscle weakness (generalized): Secondary | ICD-10-CM | POA: Diagnosis not present

## 2020-07-12 DIAGNOSIS — R2681 Unsteadiness on feet: Secondary | ICD-10-CM

## 2020-07-12 NOTE — Therapy (Signed)
[facility] [place], [place], [address] Phone: [phone]   Fax:  [phone]  Physical Therapy Treatment  Patient Details  Name: Jonathan Burnett MRN: [id number] Date of Birth: 11/05/1950 Referring Provider (PT): Claretta Fraise, MD   Encounter Date: 07/12/2020   PT End of Session - 07/12/20 0905    Visit Number 2    Number of Visits 12    Date for PT Re-Evaluation 08/23/20    Authorization Type [employer]; Progress note every 10th visit    PT Start Time 0900    PT Stop Time 0941    PT Time Calculation (min) 41 min    Activity Tolerance Patient tolerated treatment well    Behavior During Therapy [facility] for tasks assessed/performed           Past Medical History:  Diagnosis Date  . Anemia   . Blood transfusion without reported diagnosis    with appendix rupture   . Colon polyp   . Diverticulosis   . ED (erectile dysfunction)   . GERD (gastroesophageal reflux disease)    diet controlled  . Hyperlipidemia   . Hypoglycemia   . Wears dentures full  . Wears glasses     Past Surgical History:  Procedure Laterality Date  . APPENDECTOMY    . CHOLECYSTECTOMY    . COLONOSCOPY    . COLONOSCOPY WITH PROPOFOL N/A 02/28/2019   Procedure: COLONOSCOPY WITH PROPOFOL;  Surgeon: Rush Landmark Telford Nab., MD;  Location: [place];  Service: Gastroenterology;  Laterality: N/A;  . COLONOSCOPY WITH PROPOFOL N/A 10/24/2019   Procedure: COLONOSCOPY WITH PROPOFOL;  Surgeon: Rush Landmark Telford Nab., MD;  Location: [place];  Service: Gastroenterology;  Laterality: N/A;  . ENDOSCOPIC MUCOSAL RESECTION N/A 02/28/2019   Procedure: ENDOSCOPIC MUCOSAL RESECTION;  Surgeon: Rush Landmark Telford Nab., MD;  Location: [place];  Service: Gastroenterology;  Laterality: N/A;  . HEMOSTASIS CLIP PLACEMENT  02/28/2019   Procedure: HEMOSTASIS CLIP PLACEMENT;  Surgeon: Irving Copas., MD;  Location: [place];  Service:  Gastroenterology;;  . POLYPECTOMY    . POLYPECTOMY  10/24/2019   Procedure: POLYPECTOMY;  Surgeon: Mansouraty, Telford Nab., MD;  Location: [place];  Service: Gastroenterology;;  . Lia Foyer LIFTING INJECTION  02/28/2019   Procedure: SUBMUCOSAL LIFTING INJECTION;  Surgeon: Irving Copas., MD;  Location: [place];  Service: Gastroenterology;;    There were no vitals filed for this visit.   Subjective Assessment - 07/12/20 0904    Subjective COVID-19 screening performed upon arrival.Patient arrived doing well. No recent falls reported.    Pertinent History Fall risk,, GERD    Limitations House hold activities;Standing;Walking    Patient Stated Goals stop falls    Currently in Pain? Yes    Pain Score 6     Pain Location Rib cage    Pain Orientation Right    Pain Descriptors / Indicators Sore    Pain Type Acute pain    Pain Onset 1 to 4 weeks ago    Pain Frequency Intermittent    Aggravating Factors  certain movements    Pain Relieving Factors rest                             [facility] Adult PT Treatment/Exercise - 07/12/20 0001      Knee/Hip Exercises: Aerobic   Nustep Level 6 x12 mins      Knee/Hip Exercises: Seated   Long Arc Quad Strengthening;Both;3 sets;10 reps;Weights  Long Arc Quad Weight 4 lbs.    Ball Squeeze x30    Clamshell with TheraBand Red   x20   Other Seated Knee/Hip Exercises Core sets with red swiss x20reps    Marching Strengthening;Both;2 sets;10 reps   red band   Hamstring Curl Strengthening;Both;20 reps    Hamstring Limitations red band      Knee/Hip Exercises: Supine   Bridges Strengthening;Both;20 reps    Straight Leg Raises Strengthening;Both;2 sets;10 reps               Balance Exercises - 07/12/20 0001      Balance Exercises: Standing   Standing Eyes Opened Wide (BOA);Narrow base of support (BOS);Foam/compliant surface   with trunk rotation 2x10   Rockerboard Anterior/posterior   x22min   Sidestepping  Upper extremity support;5 reps    Marching Foam/compliant surface   2x10   Heel Raises 20 reps    Toe Raise 20 reps    Sit to Stand Upper extremity support;Elevated surface   2x10   Other Standing Exercises toe taps 2x10 6" step                  PT Long Term Goals - 07/12/20 0906      PT LONG TERM GOAL #1   Title Patient will be independent with HEP    Time 6    Period Weeks    Status On-going      PT LONG TERM GOAL #2   Title Patient will perform 5x sit to stand in 12 seconds or less to decrease fall risk.    Time 6    Period Weeks    Status On-going      PT LONG TERM GOAL #3   Title Patient will decrease risk of falls as noted by 56/56 on Berg Balance Scale    Time 6    Period Weeks    Status On-going                 Plan - 07/12/20 0935    Clinical Impression Statement Patient tolerated treatment well today. patient able to progress with all activities for balance and strength. Patient reported doing well with exercises today and able to tolerate balance with minimal LOB. Patient able to recover from LOB with parllel bars with SBA for safety.    Personal Factors and Comorbidities Age;Time since onset of injury/illness/exacerbation    Examination-Activity Limitations Locomotion Level;Transfers    Examination-Participation Restrictions Yard Work    Stability/Clinical Decision Making Stable/Uncomplicated    Rehab Potential Good    PT Frequency 2x / week    PT Duration 6 weeks    PT Treatment/Interventions ADLs/Self Care Home Management;Gait training;Stair training;Functional mobility training;Therapeutic activities;Therapeutic exercise;Balance training;Neuromuscular re-education;Manual techniques;Passive range of motion;Patient/family education    PT Next Visit Plan Nustep, balance activities, LE stregthening and stability, core strengthening    Consulted and Agree with Plan of Care Patient           Patient will benefit from skilled therapeutic  intervention in order to improve the following deficits and impairments:  Decreased activity tolerance,Decreased balance,Postural dysfunction,Decreased strength,Difficulty walking,Abnormal gait  Visit Diagnosis: Unsteadiness on feet  Muscle weakness (generalized)     Problem List Patient Active Problem List   Diagnosis Date Noted  . COPD (chronic obstructive pulmonary disease) ([place]) 01/16/2020  . Erectile dysfunction 10/13/2019  . Abnormal colonoscopy 01/18/2019  . Tubular adenoma of colon 01/18/2019  . History of colonic polyps 01/18/2019  . HLD (hyperlipidemia)  09/03/2016  . Keratitis, herpetic 07/11/2014  . Herpes simplex iridocyclitis 08/12/2011  . Intermediate uveitis 02/11/2011  . GERD (gastroesophageal reflux disease) 11/24/2008    Chivonne Rascon P, PTA 07/12/2020, 9:44 AM  [facility] [place], [place], [address] Phone: [phone]   Fax:  [phone]  Name: Gracin Mcpartland MRN: [id number] Date of Birth: 1951/03/07

## 2020-07-19 ENCOUNTER — Ambulatory Visit: Payer: Medicare Other | Attending: Family Medicine | Admitting: Physical Therapy

## 2020-07-19 ENCOUNTER — Other Ambulatory Visit: Payer: Self-pay

## 2020-07-19 DIAGNOSIS — M6281 Muscle weakness (generalized): Secondary | ICD-10-CM | POA: Insufficient documentation

## 2020-07-19 DIAGNOSIS — R2681 Unsteadiness on feet: Secondary | ICD-10-CM | POA: Insufficient documentation

## 2020-07-19 NOTE — Therapy (Signed)
[facility] [place], [place], [address] Phone: [phone]   Fax:  [phone]  Physical Therapy Treatment  Patient Details  Name: Jonathan Burnett MRN: [id number] Date of Birth: 1950/07/07 Referring Provider (PT): Claretta Fraise, MD   Encounter Date: 07/19/2020   PT End of Session - 07/19/20 0905    Visit Number 3    Number of Visits 12    Date for PT Re-Evaluation 08/23/20    Authorization Type [employer]; Progress note every 10th visit    PT Start Time 0900    PT Stop Time 0943    PT Time Calculation (min) 43 min    Activity Tolerance Patient tolerated treatment well    Behavior During Therapy [facility] for tasks assessed/performed           Past Medical History:  Diagnosis Date  . Anemia   . Blood transfusion without reported diagnosis    with appendix rupture   . Colon polyp   . Diverticulosis   . ED (erectile dysfunction)   . GERD (gastroesophageal reflux disease)    diet controlled  . Hyperlipidemia   . Hypoglycemia   . Wears dentures full  . Wears glasses     Past Surgical History:  Procedure Laterality Date  . APPENDECTOMY    . CHOLECYSTECTOMY    . COLONOSCOPY    . COLONOSCOPY WITH PROPOFOL N/A 02/28/2019   Procedure: COLONOSCOPY WITH PROPOFOL;  Surgeon: Rush Landmark Telford Nab., MD;  Location: [place];  Service: Gastroenterology;  Laterality: N/A;  . COLONOSCOPY WITH PROPOFOL N/A 10/24/2019   Procedure: COLONOSCOPY WITH PROPOFOL;  Surgeon: Rush Landmark Telford Nab., MD;  Location: [place];  Service: Gastroenterology;  Laterality: N/A;  . ENDOSCOPIC MUCOSAL RESECTION N/A 02/28/2019   Procedure: ENDOSCOPIC MUCOSAL RESECTION;  Surgeon: Rush Landmark Telford Nab., MD;  Location: [place];  Service: Gastroenterology;  Laterality: N/A;  . HEMOSTASIS CLIP PLACEMENT  02/28/2019   Procedure: HEMOSTASIS CLIP PLACEMENT;  Surgeon: Irving Copas., MD;  Location: [place];  Service:  Gastroenterology;;  . POLYPECTOMY    . POLYPECTOMY  10/24/2019   Procedure: POLYPECTOMY;  Surgeon: Mansouraty, Telford Nab., MD;  Location: [place];  Service: Gastroenterology;;  . Lia Foyer LIFTING INJECTION  02/28/2019   Procedure: SUBMUCOSAL LIFTING INJECTION;  Surgeon: Irving Copas., MD;  Location: [place];  Service: Gastroenterology;;    There were no vitals filed for this visit.   Subjective Assessment - 07/19/20 0903    Subjective COVID-19 screening performed upon arrival.Patient arrived doing well. patient arrived and doing well with no recent falls reported.    Pertinent History Fall risk,, GERD    Limitations House hold activities;Standing;Walking    Patient Stated Goals stop falls    Currently in Pain? Yes    Pain Score 5     Pain Location Rib cage    Pain Orientation Right    Pain Descriptors / Indicators Discomfort;Sore    Pain Type Acute pain    Pain Onset More than a month ago    Pain Frequency Intermittent    Aggravating Factors  certain movements    Pain Relieving Factors at rest                             [facility] Adult PT Treatment/Exercise - 07/19/20 0001      Knee/Hip Exercises: Aerobic   Nustep Level 6 x12 mins UE/LE activity      Knee/Hip Exercises: Seated  Long Arc [employer] Strengthening;Both;3 sets;10 reps;Weights    Long Arc [employer] 4 lbs.    Clamshell with TheraBand Red   x30   Marching Strengthening;Both;2 sets;10 reps   3x10   Hamstring Curl Strengthening;Both;20 reps    Hamstring Limitations red band      Knee/Hip Exercises: Supine   Bridges Strengthening;Both;20 reps    Straight Leg Raises Strengthening;Both;2 sets;10 reps      Knee/Hip Exercises: Sidelying   Hip ABduction Strengthening;Both;20 reps               Balance Exercises - 07/19/20 0001      Balance Exercises: Standing   Standing Eyes Opened Narrow base of support (BOS);Foam/compliant surface   2x10 with truck rotation   Rockerboard  Anterior/posterior   x56min   Sidestepping Foam/compliant support;5 reps;1 rep    Marching Foam/compliant surface   2x10   Heel Raises 20 reps   on foam pad   Sit to Stand Standard surface;Without upper extremity support   x17   Other Standing Exercises step ups 6" 2x10    Other Standing Exercises Comments step over with balance pods forward and side to side                  PT Long Term Goals - 07/19/20 0904      PT LONG TERM GOAL #1   Title Patient will be independent with HEP    Time 6    Period Weeks    Status On-going      PT LONG TERM GOAL #2   Title Patient will perform 5x sit to stand in 12 seconds or less to decrease fall risk.    Baseline x5 in 20 seconds yet good control and no UE support 07/19/20    Time 6    Period Weeks    Status On-going      PT LONG TERM GOAL #3   Title Patient will decrease risk of falls as noted by 56/56 on Berg Balance Scale    Time 6    Period Weeks    Status On-going                 Plan - 07/19/20 0934    Clinical Impression Statement Patient tolerated treatment well today and progressing with balance and LE strength exercises today. Patient had few LOB with balance exercises yet able to recover independently with SBA for safety. Patient required no UE support with step ups today. Right LE is weak and causes some difficulty with standing activity. Goals progressing this week.    Personal Factors and Comorbidities Age;Time since onset of injury/illness/exacerbation    Examination-Activity Limitations Locomotion Level;Transfers    Examination-Participation Restrictions Yard Work    Stability/Clinical Decision Making Stable/Uncomplicated    Rehab Potential Good    PT Frequency 2x / week    PT Duration 6 weeks    PT Treatment/Interventions ADLs/Self Care Home Management;Gait training;Stair training;Functional mobility training;Therapeutic activities;Therapeutic exercise;Balance training;Neuromuscular re-education;Manual  techniques;Passive range of motion;Patient/family education    PT Next Visit Plan Nustep, balance activities, LE stregthening and stability, core strengthening    Consulted and Agree with Plan of Care Patient           Patient will benefit from skilled therapeutic intervention in order to improve the following deficits and impairments:  Decreased activity tolerance,Decreased balance,Postural dysfunction,Decreased strength,Difficulty walking,Abnormal gait  Visit Diagnosis: Unsteadiness on feet  Muscle weakness (generalized)     Problem List Patient Active Problem List  Diagnosis Date Noted  . COPD (chronic obstructive pulmonary disease) ([place]) 01/16/2020  . Erectile dysfunction 10/13/2019  . Abnormal colonoscopy 01/18/2019  . Tubular adenoma of colon 01/18/2019  . History of colonic polyps 01/18/2019  . HLD (hyperlipidemia) 09/03/2016  . Keratitis, herpetic 07/11/2014  . Herpes simplex iridocyclitis 08/12/2011  . Intermediate uveitis 02/11/2011  . GERD (gastroesophageal reflux disease) 11/24/2008    Terri Rorrer P, PTA 07/19/2020, 9:47 AM  [facility] [place], [place], [address] Phone: [phone]   Fax:  [phone]  Name: Alexie Samson MRN: [id number] Date of Birth: 06-19-1950

## 2020-07-26 ENCOUNTER — Ambulatory Visit: Payer: Medicare Other | Admitting: Physical Therapy

## 2020-10-12 ENCOUNTER — Other Ambulatory Visit: Payer: Self-pay

## 2020-10-12 ENCOUNTER — Ambulatory Visit (INDEPENDENT_AMBULATORY_CARE_PROVIDER_SITE_OTHER): Payer: Medicare Other | Admitting: Family Medicine

## 2020-10-12 ENCOUNTER — Encounter: Payer: Self-pay | Admitting: Family Medicine

## 2020-10-12 VITALS — BP 117/77 | HR 75 | Ht 68.5 in | Wt 142.0 lb

## 2020-10-12 DIAGNOSIS — J439 Emphysema, unspecified: Secondary | ICD-10-CM

## 2020-10-12 DIAGNOSIS — E782 Mixed hyperlipidemia: Secondary | ICD-10-CM | POA: Diagnosis not present

## 2020-10-12 DIAGNOSIS — K219 Gastro-esophageal reflux disease without esophagitis: Secondary | ICD-10-CM

## 2020-10-12 DIAGNOSIS — Z23 Encounter for immunization: Secondary | ICD-10-CM | POA: Diagnosis not present

## 2020-10-12 DIAGNOSIS — R5383 Other fatigue: Secondary | ICD-10-CM

## 2020-10-12 NOTE — Progress Notes (Signed)
 BP 117/77   Pulse 75   Ht 5' 8.5" (1.74 m)   Wt 142 lb (64.4 kg)   SpO2 96%   BMI 21.28 kg/m    Subjective:   Patient ID: Jonathan Burnett, male    DOB: 02/06/1951, 70 y.o.   MRN: [id number]  HPI: Sivan Charlesworth is a 70 y.o. male presenting on 10/12/2020 for Medical Management of Chronic Issues and Hyperlipidemia   HPI Hyperlipidemia Patient is coming in for recheck of his hyperlipidemia. The patient is currently taking no medication currently has been diet controlled. They deny any issues with myalgias or history of liver damage from it. They deny any focal numbness or weakness or chest pain.   COPD Patient is coming in for COPD recheck today.  He is currently on Symbicort and Zyrtec as needed, does not recall time.  He has a mild chronic cough but denies any major coughing spells or wheezing spells.  He has 0 nighttime symptoms per week and 1 daytime symptoms per week currently.   GERD Patient is currently on no medication currently has been controlled..  She denies any major symptoms or abdominal pain or belching or burping. She denies any blood in her stool or lightheadedness or dizziness.  He does complain of fatigue and decreased energy.  Relevant past medical, surgical, family and social history reviewed and updated as indicated. Interim medical history since our last visit reviewed. Allergies and medications reviewed and updated.  Review of Systems  Constitutional:  Negative for chills and fever.  Eyes:  Negative for visual disturbance.  Respiratory:  Negative for shortness of breath and wheezing.   Cardiovascular:  Negative for chest pain and leg swelling.  Musculoskeletal:  Negative for back pain and gait problem.  Skin:  Negative for rash.  Neurological:  Negative for dizziness, weakness and light-headedness.  All other systems reviewed and are negative.  Per HPI unless specifically indicated above   Allergies as of 10/12/2020       Reactions   Aspirin    GI  upset/nausea        Medication List        Accurate as of October 12, 2020  8:55 AM. If you have any questions, ask your nurse or doctor.          budesonide-formoterol 80-4.5 MCG/ACT inhaler Commonly known as: SYMBICORT Inhale 2 puffs into the lungs 2 (two) times daily.   cetirizine 10 MG tablet Commonly known as: ZYRTEC Take 10 mg by mouth daily as needed for allergies.         Objective:   BP 117/77   Pulse 75   Ht 5' 8.5" (1.74 m)   Wt 142 lb (64.4 kg)   SpO2 96%   BMI 21.28 kg/m   Wt Readings from Last 3 Encounters:  10/12/20 142 lb (64.4 kg)  06/26/20 148 lb (67.1 kg)  04/19/20 146 lb (66.2 kg)    Physical Exam Vitals and nursing note reviewed.  Constitutional:      General: He is not in acute distress.    Appearance: He is well-developed. He is not diaphoretic.  Eyes:     General: No scleral icterus.    Conjunctiva/sclera: Conjunctivae normal.  Neck:     Thyroid: No thyromegaly.  Cardiovascular:     Rate and Rhythm: Normal rate and regular rhythm.     Heart sounds: Normal heart sounds. No murmur heard. Pulmonary:     Effort: Pulmonary effort is normal. No respiratory distress.       Breath sounds: Normal breath sounds. No wheezing.  Musculoskeletal:        General: Normal range of motion.     Cervical back: Neck supple.  Lymphadenopathy:     Cervical: No cervical adenopathy.  Skin:    General: Skin is warm and dry.     Findings: No rash.  Neurological:     Mental Status: He is alert and oriented to person, place, and time.     Coordination: Coordination normal.  Psychiatric:        Behavior: Behavior normal.      Assessment & Plan:   Problem List Items Addressed This Visit       Respiratory   COPD (chronic obstructive pulmonary disease) (HCC)     Digestive   GERD (gastroesophageal reflux disease)   Relevant Orders   CMP14+EGFR   CBC with Differential/Platelet     Other   HLD (hyperlipidemia) - Primary   Relevant Orders    Lipid panel   Other Visit Diagnoses     Vaccine for VZV (varicella-zoster virus)       Relevant Orders   Varicella-zoster vaccine IM (Shingrix)   Other fatigue       Relevant Orders   TSH       Continue current medication.  No changes.  Seems to be doing well except the fatigue, discussed starting melatonin because is not sleeping as well and will check blood work. Follow up plan: Return in about 1 year (around 10/12/2021), or if symptoms worsen or fail to improve, for Hyperlipidemia COPD GERD.  Counseling provided for all of the vaccine components No orders of the defined types were placed in this encounter.    , MD [facility] 10/12/2020, 8:55 AM     

## 2020-10-13 LAB — LIPID PANEL
Chol/HDL Ratio: 3.4 ratio (ref 0.0–5.0)
Cholesterol, Total: 137 mg/dL (ref 100–199)
HDL: 40 mg/dL (ref >39)
LDL Chol Calc (NIH): 78 mg/dL (ref 0–99)
Triglycerides: 99 mg/dL (ref 0–149)
VLDL Cholesterol Cal: 19 mg/dL (ref 5–40)

## 2020-10-13 LAB — CMP14+EGFR
ALT: 10 IU/L (ref 0–44)
AST: 17 IU/L (ref 0–40)
Albumin/Globulin Ratio: 1.9 (ref 1.2–2.2)
Albumin: 4.5 g/dL (ref 3.7–4.7)
Alkaline Phosphatase: 84 IU/L (ref 44–121)
BUN/Creatinine Ratio: 17 (ref 10–24)
BUN: 17 mg/dL (ref 8–27)
Bilirubin Total: 0.3 mg/dL (ref 0.0–1.2)
CO2: 21 mmol/L (ref 20–29)
Calcium: 9.6 mg/dL (ref 8.6–10.2)
Chloride: 104 mmol/L (ref 96–106)
Creatinine, Ser: 0.98 mg/dL (ref 0.76–1.27)
Globulin, Total: 2.4 g/dL (ref 1.5–4.5)
Glucose: 84 mg/dL (ref 65–99)
Potassium: 4.7 mmol/L (ref 3.5–5.2)
Sodium: 138 mmol/L (ref 134–144)
Total Protein: 6.9 g/dL (ref 6.0–8.5)
eGFR: 82 mL/min/{1.73_m2} (ref >59)

## 2020-10-13 LAB — CBC WITH DIFFERENTIAL/PLATELET
Basophils Absolute: 0.1 10*3/uL (ref 0.0–0.2)
Basos: 2 %
EOS (ABSOLUTE): 0.4 10*3/uL (ref 0.0–0.4)
Eos: 5 %
Hematocrit: 44.9 % (ref 37.5–51.0)
Hemoglobin: 15.2 g/dL (ref 13.0–17.7)
Immature Grans (Abs): 0 10*3/uL (ref 0.0–0.1)
Immature Granulocytes: 1 %
Lymphocytes Absolute: 2.2 10*3/uL (ref 0.7–3.1)
Lymphs: 28 %
MCH: 31.3 pg (ref 26.6–33.0)
MCHC: 33.9 g/dL (ref 31.5–35.7)
MCV: 92 fL (ref 79–97)
Monocytes Absolute: 0.7 10*3/uL (ref 0.1–0.9)
Monocytes: 9 %
Neutrophils Absolute: 4.3 10*3/uL (ref 1.4–7.0)
Neutrophils: 55 %
Platelets: 229 10*3/uL (ref 150–450)
RBC: 4.86 x10E6/uL (ref 4.14–5.80)
RDW: 13.4 % (ref 11.6–15.4)
WBC: 7.8 10*3/uL (ref 3.4–10.8)

## 2020-10-13 LAB — TSH: TSH: 2.34 u[IU]/mL (ref 0.450–4.500)

## 2020-10-24 ENCOUNTER — Encounter: Payer: Self-pay | Admitting: Gastroenterology

## 2020-11-01 ENCOUNTER — Encounter: Payer: Self-pay | Admitting: Gastroenterology

## 2020-11-15 ENCOUNTER — Ambulatory Visit (AMBULATORY_SURGERY_CENTER): Payer: Medicare Other

## 2020-11-15 ENCOUNTER — Other Ambulatory Visit: Payer: Self-pay

## 2020-11-15 ENCOUNTER — Telehealth: Payer: Self-pay | Admitting: Gastroenterology

## 2020-11-15 VITALS — Ht 68.5 in | Wt 141.0 lb

## 2020-11-15 DIAGNOSIS — Z1211 Encounter for screening for malignant neoplasm of colon: Secondary | ICD-10-CM

## 2020-11-15 DIAGNOSIS — Z86010 Personal history of colonic polyps: Secondary | ICD-10-CM

## 2020-11-15 MED ORDER — SUTAB 1479-225-188 MG PO TABS: 12.0000 | ORAL_TABLET | ORAL | 0 refills | Status: DC

## 2020-11-15 NOTE — Telephone Encounter (Signed)
Spoke with pharmacist and gave them the Medicare Sutab coupon information.  The cost of prep is $40 now.  I called pt and made him aware of this and he is going to pick it up.

## 2020-11-15 NOTE — Progress Notes (Signed)
Patient's pre-visit was done today over the phone with the patient due to COVID-19 pandemic. Patient denies any allergies to Eggs and Soy. Patient denies any problems with anesthesia/sedation. Patient denies taking diet pills or blood thinners. No home Oxygen. Packet of Prep instructions mailed to patient including a copy of a consent form-pt is aware. Patient understands to call us back with any questions or concerns. Patient is aware of our care-partner policy and 0000000 safety protocol.   The patient is COVID-19 vaccinated.

## 2020-11-27 ENCOUNTER — Encounter: Payer: Self-pay | Admitting: Gastroenterology

## 2020-11-29 ENCOUNTER — Ambulatory Visit (AMBULATORY_SURGERY_CENTER): Payer: Medicare Other | Admitting: Gastroenterology

## 2020-11-29 ENCOUNTER — Encounter: Payer: Self-pay | Admitting: Gastroenterology

## 2020-11-29 ENCOUNTER — Other Ambulatory Visit: Payer: Self-pay

## 2020-11-29 VITALS — BP 99/69 | HR 60 | Temp 98.4°F | Resp 22 | Ht 68.5 in | Wt 141.0 lb

## 2020-11-29 DIAGNOSIS — D123 Benign neoplasm of transverse colon: Secondary | ICD-10-CM

## 2020-11-29 DIAGNOSIS — D122 Benign neoplasm of ascending colon: Secondary | ICD-10-CM

## 2020-11-29 DIAGNOSIS — Z86010 Personal history of colonic polyps: Secondary | ICD-10-CM

## 2020-11-29 MED ORDER — SODIUM CHLORIDE 0.9 % IV SOLN: 500.0000 mL | Freq: Once | INTRAVENOUS | Status: DC

## 2020-11-29 NOTE — Progress Notes (Signed)
Pt's states no medical or surgical changes since previsit or office visit. VS assessed by AS 

## 2020-11-29 NOTE — Progress Notes (Signed)
GASTROENTEROLOGY PROCEDURE H&P NOTE   Primary Care Physician: Dettinger, Fransisca Kaufmann, MD  HPI: Jonathan Burnett is a 70 y.o. male who presents for Colonoscopy for colon polyp surveillance history of large TA s/p resection in 2020 and negative on 2021.  Past Medical History:  Diagnosis Date   Anemia    Blood transfusion without reported diagnosis    with appendix rupture    Colon polyp    Diverticulosis    ED (erectile dysfunction)    GERD (gastroesophageal reflux disease)    diet controlled   Hyperlipidemia    Hypoglycemia    Wears dentures full   Wears glasses    Past Surgical History:  Procedure Laterality Date   APPENDECTOMY     CHOLECYSTECTOMY     COLONOSCOPY     COLONOSCOPY WITH PROPOFOL N/A 02/28/2019   Procedure: COLONOSCOPY WITH PROPOFOL;  Surgeon: Irving Copas., MD;  Location: [facility];  Service: Gastroenterology;  Laterality: N/A;   COLONOSCOPY WITH PROPOFOL N/A 10/24/2019   Procedure: COLONOSCOPY WITH PROPOFOL;  Surgeon: Rush Landmark Telford Nab., MD;  Location: [facility];  Service: Gastroenterology;  Laterality: N/A;   ENDOSCOPIC MUCOSAL RESECTION N/A 02/28/2019   Procedure: ENDOSCOPIC MUCOSAL RESECTION;  Surgeon: Rush Landmark Telford Nab., MD;  Location: [facility];  Service: Gastroenterology;  Laterality: N/A;   HEMOSTASIS CLIP PLACEMENT  02/28/2019   Procedure: HEMOSTASIS CLIP PLACEMENT;  Surgeon: Irving Copas., MD;  Location: [place];  Service: Gastroenterology;;   POLYPECTOMY     POLYPECTOMY  10/24/2019   Procedure: POLYPECTOMY;  Surgeon: Irving Copas., MD;  Location: [facility];  Service: Gastroenterology;;   SUBMUCOSAL LIFTING INJECTION  02/28/2019   Procedure: SUBMUCOSAL LIFTING INJECTION;  Surgeon: Irving Copas., MD;  Location: [facility];  Service: Gastroenterology;;   Current Outpatient Medications  Medication Sig Dispense Refill   budesonide-formoterol (SYMBICORT) 80-4.5 MCG/ACT inhaler Inhale 2 puffs  into the lungs 2 (two) times daily. 1 each 3   cetirizine (ZYRTEC) 10 MG tablet Take 10 mg by mouth daily as needed for allergies. (Patient not taking: Reported on 11/29/2020)     Current Facility-Administered Medications  Medication Dose Route Frequency Provider Last Rate Last Admin   0.9 %  sodium chloride infusion  500 mL Intravenous Once Mansouraty, Telford Nab., MD        Current Outpatient Medications:    budesonide-formoterol (SYMBICORT) 80-4.5 MCG/ACT inhaler, Inhale 2 puffs into the lungs 2 (two) times daily., Disp: 1 each, Rfl: 3   cetirizine (ZYRTEC) 10 MG tablet, Take 10 mg by mouth daily as needed for allergies. (Patient not taking: Reported on 11/29/2020), Disp: , Rfl:   Current Facility-Administered Medications:    0.9 %  sodium chloride infusion, 500 mL, Intravenous, Once, Mansouraty, Telford Nab., MD Allergies  Allergen Reactions   Aspirin     GI upset/nausea   Family History  Problem Relation Age of Onset   Hypertension Father    Lung cancer Father    Stomach cancer Brother    Healthy Sister    Healthy Daughter    Healthy Brother    Healthy Brother    Healthy Brother    Healthy Sister    Healthy Daughter    Colon polyps Neg Hx    Colon cancer Neg Hx    Esophageal cancer Neg Hx    Rectal cancer Neg Hx    Inflammatory bowel disease Neg Hx    Liver disease Neg Hx    Pancreatic cancer Neg Hx    Social History  Socioeconomic History   Marital status: Married    Spouse name: Inez Catalina   Number of children: 4   Years of education: 10   Highest education level: 9th grade  Occupational History   Occupation: English as a second language teacher: [employer]    Comment: Retired  Tobacco Use   Smoking status: Never   Smokeless tobacco: Never  Vaping Use   Vaping Use: Never used  Substance and Sexual Activity   Alcohol use: No   Drug use: No   Sexual activity: Yes  Other Topics Concern   Not on file  Social History Narrative   Patient is right-handed. He lives with  his wife in a two level home, Restaurant manager, fast food on the first floor.      Enjoys being outdoors. Has one pet dog.    Social Determinants of Health   Financial Resource Strain: Not on file  Food Insecurity: Not on file  Transportation Needs: Not on file  Physical Activity: Not on file  Stress: Not on file  Social Connections: Not on file  Intimate Partner Violence: Not on file    Physical Exam: Vital signs in last 24 hours: '@VSRANGES'$ @   GEN: NAD EYE: Sclerae anicteric ENT: MMM CV: Non-tachycardic GI: Soft, NT/ND NEURO:  Alert & Oriented x 3  Lab Results: No results for input(s): WBC, HGB, HCT, PLT in the last 72 hours. BMET No results for input(s): NA, K, CL, CO2, GLUCOSE, BUN, CREATININE, CALCIUM in the last 72 hours. LFT No results for input(s): PROT, ALBUMIN, AST, ALT, ALKPHOS, BILITOT, BILIDIR, IBILI in the last 72 hours. PT/INR No results for input(s): LABPROT, INR in the last 72 hours.   Impression / Plan: This is a 70 y.o.male who presents for Colonoscopy for colon polyp surveillance history of large TA s/p resection in 2020 and negative on 2021.  The risks and benefits of endoscopic evaluation/treatment were discussed with the patient and/or family; these include but are not limited to the risk of perforation, infection, bleeding, missed lesions, lack of diagnosis, severe illness requiring hospitalization, as well as anesthesia and sedation related illnesses.  The patient's history has been reviewed, patient examined, no change in status, and deemed stable for procedure.  The patient and/or family is agreeable to proceed.    Justice Britain, MD [facility] Office # [phone]

## 2020-11-29 NOTE — Progress Notes (Signed)
pt tolerated well. VSS. awake and to recovery. Report given to RN.  

## 2020-11-29 NOTE — Progress Notes (Signed)
Called to room to assist during endoscopic procedure.  Patient ID and intended procedure confirmed with present staff. Received instructions for my participation in the procedure from the performing physician.  

## 2020-11-29 NOTE — Op Note (Addendum)
[facility] Patient Name: Jonathan Burnett Procedure Date: 11/29/2020 1:04 PM MRN: [id number] Endoscopist: Justice Britain , MD Age: 69 Referring MD:  Date of Birth: Dec 14, 1950 Gender: Male Account #: [id number] Procedure:                Colonoscopy Indications:              Surveillance: History of piecemeal removal adenoma                            on colonoscopy in 2020 and then 2021 follow up                            without recurrence in ascending colon (first                            haustral fold) Medicines:                Monitored Anesthesia Care Procedure:                Pre-Anesthesia Assessment:                           - Prior to the procedure, a History and Physical                            was performed, and patient medications and                            allergies were reviewed. The patient's tolerance of                            previous anesthesia was also reviewed. The risks                            and benefits of the procedure and the sedation                            options and risks were discussed with the patient.                            All questions were answered, and informed consent                            was obtained. Prior Anticoagulants: The patient has                            taken no previous anticoagulant or antiplatelet                            agents. ASA Grade Assessment: III - A patient with                            severe systemic disease. After reviewing the risks  and benefits, the patient was deemed in                            satisfactory condition to undergo the procedure.                           After obtaining informed consent, the colonoscope                            was passed under direct vision. Throughout the                            procedure, the patient's blood pressure, pulse, and                            oxygen saturations were monitored continuously. The                             Olympus CF-HQ190L [id number]) Colonoscope was                            introduced through the anus and advanced to the 5                            cm into the ileum. The colonoscopy was performed                            without difficulty. The patient tolerated the                            procedure. The quality of the bowel preparation was                            good. The terminal ileum, ileocecal valve,                            appendiceal orifice, and rectum were photographed. Scope In: 1:50:50 PM Scope Out: 2:07:07 PM Scope Withdrawal Time: 0 hours 12 minutes 56 seconds  Total Procedure Duration: 0 hours 16 minutes 17 seconds  Findings:                 The digital rectal exam findings include                            hemorrhoids. Pertinent negatives include no                            palpable rectal lesions.                           The terminal ileum and ileocecal valve appeared                            normal.  A single medium-sized angioectasia with typical                            arborization was found in the cecum.                           Three sessile polyps were found in the transverse                            colon (1) and ascending colon (2). The polyps were                            2 to 5 mm in size. These polyps were removed with a                            cold snare. Resection and retrieval were complete.                           Multiple small-mouthed diverticula were found in                            the entire colon.                           Normal mucosa was found in the entire colon                            otherwise.                           Non-bleeding non-thrombosed external and internal                            hemorrhoids were found during retroflexion, during                            perianal exam and during digital exam. The                            hemorrhoids  were Grade III (internal hemorrhoids                            that prolapse but require manual reduction). Complications:            No immediate complications. Estimated Blood Loss:     Estimated blood loss was minimal. Impression:               - Hemorrhoids found on digital rectal exam.                           - The examined portion of the ileum was normal.                           - A single colonic angioectasia.                           -  Three 2 to 5 mm polyps in the transverse colon                            and in the ascending colon, removed with a cold                            snare. Resected and retrieved.                           - Diverticulosis in the entire examined colon.                           - Normal mucosa in the entire examined colon                            otherwise.                           - Non-bleeding non-thrombosed external and internal                            hemorrhoids. Recommendation:           - The patient will be observed post-procedure,                            until all discharge criteria are met.                           - Discharge patient to home.                           - Patient has a contact number available for                            emergencies. The signs and symptoms of potential                            delayed complications were discussed with the                            patient. Return to normal activities tomorrow.                            Written discharge instructions were provided to the                            patient.                           - High fiber diet.                           - Use FiberCon 1-2 tablets PO daily.                           -  Continue present medications.                           - Await pathology results.                           - Repeat colonoscopy in 3 years for surveillance                            due to history of previous advanced adenoma.                            - The findings and recommendations were discussed                            with the patient.                           - The findings and recommendations were discussed                            with the patient's family. Justice Britain, MD 11/29/2020 2:15:23 PM

## 2020-11-29 NOTE — Patient Instructions (Signed)
Discharge discharge instructions given. Handouts on polyps,diverticulosis and hemorrhoids. Resume previous medications. YOU HAD AN ENDOSCOPIC PROCEDURE TODAY AT [facility]:   Refer to the procedure report that was given to you for any specific questions about what was found during the examination.  If the procedure report does not answer your questions, please call your gastroenterologist to clarify.  If you requested that your care partner not be given the details of your procedure findings, then the procedure report has been included in a sealed envelope for you to review at your convenience later.  YOU SHOULD EXPECT: Some feelings of bloating in the abdomen. Passage of more gas than usual.  Walking can help get rid of the air that was put into your GI tract during the procedure and reduce the bloating. If you had a lower endoscopy (such as a colonoscopy or flexible sigmoidoscopy) you may notice spotting of blood in your stool or on the toilet paper. If you underwent a bowel prep for your procedure, you may not have a normal bowel movement for a few days.  Please Note:  You might notice some irritation and congestion in your nose or some drainage.  This is from the oxygen used during your procedure.  There is no need for concern and it should clear up in a day or so.  SYMPTOMS TO REPORT IMMEDIATELY:  Following lower endoscopy (colonoscopy or flexible sigmoidoscopy):  Excessive amounts of blood in the stool  Significant tenderness or worsening of abdominal pains  Swelling of the abdomen that is new, acute  Fever of 100F or higher   For urgent or emergent issues, a gastroenterologist can be reached at any hour by calling [phone]. Do not use MyChart messaging for urgent concerns.    DIET:  We do recommend a small meal at first, but then you may proceed to your regular diet.  Drink plenty of fluids but you should avoid alcoholic beverages for 24 hours.  ACTIVITY:  You  should plan to take it easy for the rest of today and you should NOT DRIVE or use heavy machinery until tomorrow (because of the sedation medicines used during the test).    FOLLOW UP: Our staff will call the number listed on your records 48-72 hours following your procedure to check on you and address any questions or concerns that you may have regarding the information given to you following your procedure. If we do not reach you, we will leave a message.  We will attempt to reach you two times.  During this call, we will ask if you have developed any symptoms of COVID 19. If you develop any symptoms (ie: fever, flu-like symptoms, shortness of breath, cough etc.) before then, please call [phone].  If you test positive for Covid 19 in the 2 weeks post procedure, please call and report this information to [place].    If any biopsies were taken you will be contacted by phone or by letter within the next 1-3 weeks.  Please call us at [phone] if you have not heard about the biopsies in 3 weeks.    SIGNATURES/CONFIDENTIALITY: You and/or your care partner have signed paperwork which will be entered into your electronic medical record.  These signatures attest to the fact that that the information above on your After Visit Summary has been reviewed and is understood.  Full responsibility of the confidentiality of this discharge information lies with you and/or your care-partner.

## 2020-12-03 ENCOUNTER — Telehealth: Payer: Self-pay

## 2020-12-03 ENCOUNTER — Telehealth: Payer: Self-pay | Admitting: *Deleted

## 2020-12-03 NOTE — Telephone Encounter (Signed)
Attempted to reach patient for post-procedure f/u call, no answer at both numbers, unable to leave message as voicemail is not set up "properly". Staff will make another attempt later today to reach patient.

## 2020-12-03 NOTE — Telephone Encounter (Signed)
  Follow up Call-  Call back number 11/29/2020 12/10/2018  Post procedure Call Back phone  # [phone]   wife's phone ok to ask her about how i'm doing  Permission to leave phone message Yes Yes  Some recent data might be hidden     Patient questions:  Do you have a fever, pain , or abdominal swelling? No. Pain Score  0 *  Have you tolerated food without any problems? Yes.    Have you been able to return to your normal activities? Yes.    Do you have any questions about your discharge instructions: Diet   No. Medications  No. Follow up visit  No.  Do you have questions or concerns about your Care? No.  Actions: * If pain score is 4 or above: No action needed, pain <4.  Have you developed a fever since your procedure? no  2.   Have you had an respiratory symptoms (SOB or cough) since your procedure? no  3.   Have you tested positive for COVID 19 since your procedure no  4.   Have you had any family members/close contacts diagnosed with the COVID 19 since your procedure?  no   If yes to any of these questions please route to Joylene John, RN and Joella Prince, RN

## 2020-12-04 ENCOUNTER — Encounter: Payer: Self-pay | Admitting: Gastroenterology

## 2020-12-19 DIAGNOSIS — H2513 Age-related nuclear cataract, bilateral: Secondary | ICD-10-CM | POA: Diagnosis not present

## 2020-12-19 DIAGNOSIS — H40033 Anatomical narrow angle, bilateral: Secondary | ICD-10-CM | POA: Diagnosis not present

## 2020-12-24 DIAGNOSIS — H10013 Acute follicular conjunctivitis, bilateral: Secondary | ICD-10-CM | POA: Diagnosis not present

## 2021-01-09 ENCOUNTER — Ambulatory Visit (INDEPENDENT_AMBULATORY_CARE_PROVIDER_SITE_OTHER): Payer: Medicare Other | Admitting: Family Medicine

## 2021-01-09 ENCOUNTER — Encounter: Payer: Self-pay | Admitting: Family Medicine

## 2021-01-09 ENCOUNTER — Other Ambulatory Visit: Payer: Self-pay

## 2021-01-09 VITALS — BP 132/81 | HR 71 | Ht 68.5 in | Wt 143.0 lb

## 2021-01-09 DIAGNOSIS — Z23 Encounter for immunization: Secondary | ICD-10-CM | POA: Diagnosis not present

## 2021-01-09 DIAGNOSIS — S39012A Strain of muscle, fascia and tendon of lower back, initial encounter: Secondary | ICD-10-CM | POA: Diagnosis not present

## 2021-01-09 MED ORDER — METHYLPREDNISOLONE ACETATE 40 MG/ML IJ SUSP
80.0000 mg | Freq: Once | INTRAMUSCULAR | Status: AC
  Administered 2021-01-09: 80 mg via INTRAMUSCULAR

## 2021-01-09 MED ORDER — PREDNISONE 20 MG PO TABS: ORAL_TABLET | ORAL | 0 refills | Status: DC

## 2021-01-09 MED ORDER — BUDESONIDE-FORMOTEROL FUMARATE 80-4.5 MCG/ACT IN AERO: 2.0000 | INHALATION_SPRAY | Freq: Two times a day (BID) | RESPIRATORY_TRACT | 5 refills | Status: DC

## 2021-01-09 NOTE — Progress Notes (Signed)
BP 132/81   Pulse 71   Ht 5' 8.5" (1.74 m)   Wt 143 lb (64.9 kg)   SpO2 96%   BMI 21.43 kg/m    Subjective:   Patient ID: Jonathan Burnett, male    DOB: 12-09-1950, 70 y.o.   MRN: [id number]  HPI: Jonathan Burnett is a 70 y.o. male presenting on 01/09/2021 for Back Pain (Lower. Lost balance while using chainsaw)   HPI Patient is coming in complaining of lower back pain that is been going on for 2 days.  He says mostly on the right side and does go down his right leg a little bit but is some on the left side as well.  He says he was working with a chainsaw 2 days ago and fell onto his right side.  He says he tripped and lost his balance while he is working outside.  He denies any numbness or weakness but does say that the pain just been bothering him over the past 2 days and is not improving.  He denies any recurrent falls normally.  Denies any loss of bowel or bladder  Relevant past medical, surgical, family and social history reviewed and updated as indicated. Interim medical history since our last visit reviewed. Allergies and medications reviewed and updated.  Review of Systems  Constitutional:  Negative for chills and fever.  Respiratory:  Negative for shortness of breath and wheezing.   Cardiovascular:  Negative for chest pain and leg swelling.  Musculoskeletal:  Positive for arthralgias, back pain and myalgias. Negative for gait problem.  Skin:  Negative for rash.  Neurological:  Negative for weakness and numbness.  All other systems reviewed and are negative.  Per HPI unless specifically indicated above   Allergies as of 01/09/2021       Reactions   Aspirin    GI upset/nausea        Medication List        Accurate as of January 09, 2021  9:05 AM. If you have any questions, ask your nurse or doctor.          budesonide-formoterol 80-4.5 MCG/ACT inhaler Commonly known as: SYMBICORT Inhale 2 puffs into the lungs 2 (two) times daily.   cetirizine 10 MG  tablet Commonly known as: ZYRTEC Take 10 mg by mouth daily as needed for allergies.   predniSONE 20 MG tablet Commonly known as: DELTASONE 2 po at same time daily for 5 days Started by: Fransisca Kaufmann Audreyanna Butkiewicz, MD         Objective:   BP 132/81   Pulse 71   Ht 5' 8.5" (1.74 m)   Wt 143 lb (64.9 kg)   SpO2 96%   BMI 21.43 kg/m   Wt Readings from Last 3 Encounters:  01/09/21 143 lb (64.9 kg)  11/29/20 141 lb (64 kg)  11/15/20 141 lb (64 kg)    Physical Exam Vitals and nursing note reviewed.  Constitutional:      General: He is not in acute distress.    Appearance: He is well-developed. He is not diaphoretic.  Eyes:     General: No scleral icterus.    Conjunctiva/sclera: Conjunctivae normal.  Neck:     Thyroid: No thyromegaly.  Cardiovascular:     Rate and Rhythm: Normal rate and regular rhythm.     Heart sounds: Normal heart sounds. No murmur heard. Pulmonary:     Effort: Pulmonary effort is normal. No respiratory distress.     Breath sounds: Normal breath sounds.  No wheezing.  Musculoskeletal:        General: Normal range of motion.     Cervical back: Neck supple.     Lumbar back: Tenderness present. No deformity or bony tenderness. Negative right straight leg raise test and negative left straight leg raise test.       Back:  Lymphadenopathy:     Cervical: No cervical adenopathy.  Skin:    General: Skin is warm and dry.     Findings: No rash.  Neurological:     Mental Status: He is alert and oriented to person, place, and time.     Coordination: Coordination normal.  Psychiatric:        Behavior: Behavior normal.      Assessment & Plan:   Problem List Items Addressed This Visit   None Visit Diagnoses     Lumbar strain, initial encounter    -  Primary   Relevant Medications   predniSONE (DELTASONE) 20 MG tablet   methylPREDNISolone acetate (DEPO-MEDROL) injection 80 mg (Start on 01/09/2021  9:15 AM)   Need for immunization against influenza        Relevant Orders   Flu Vaccine QUAD High Dose(Fluad) (Completed)       Only gave 5 days of prednisone, recommended take with food and watch for his stomach issues.  Also do steroid injection.  Spoke to patient about exercises and stretches and heating pad and ice and alternating between the 2.  Also instructed patient that if it does not improve or if it worsens to come back and we can do imaging. Follow up plan: Return if symptoms worsen or fail to improve.  Counseling provided for all of the vaccine components Orders Placed This Encounter  Procedures   Flu Vaccine QUAD High Dose(Fluad)    Caryl Pina, MD [facility] 01/09/2021, 9:05 AM

## 2021-02-22 ENCOUNTER — Encounter: Payer: Self-pay | Admitting: Family

## 2021-02-22 ENCOUNTER — Ambulatory Visit (INDEPENDENT_AMBULATORY_CARE_PROVIDER_SITE_OTHER): Payer: Medicare Other | Admitting: Family

## 2021-02-22 DIAGNOSIS — R6889 Other general symptoms and signs: Secondary | ICD-10-CM

## 2021-02-22 MED ORDER — CETIRIZINE HCL 10 MG PO TABS: 10.0000 mg | ORAL_TABLET | Freq: Every day | ORAL | 1 refills | Status: DC | PRN

## 2021-02-22 MED ORDER — FLUTICASONE PROPIONATE 50 MCG/ACT NA SUSP: 2.0000 | Freq: Every day | NASAL | 6 refills | Status: DC

## 2021-02-22 MED ORDER — OSELTAMIVIR PHOSPHATE 75 MG PO CAPS
75.0000 mg | ORAL_CAPSULE | Freq: Two times a day (BID) | ORAL | 0 refills | Status: DC
Start: 2021-02-22 — End: 2021-05-01

## 2021-02-22 NOTE — Progress Notes (Signed)
Virtual Visit  Note Due to COVID-19 pandemic this visit was conducted virtually. This visit type was conducted due to national recommendations for restrictions regarding the COVID-19 Pandemic (e.g. social distancing, sheltering in place) in an effort to limit this patient's exposure and mitigate transmission in our community. All issues noted in this document were discussed and addressed.  A physical exam was not performed with this format.  I connected with Jonathan Burnett on 02/22/21 at 12:40 pm  by telephone and verified that I am speaking with the correct person using two identifiers. Jonathan Burnett is currently located at home and his wife is currently with him during visit. The provider, [name], FNP is located in their office at time of visit.  I discussed the limitations, risks, security and privacy concerns of performing an evaluation and management service by telephone and the availability of in person appointments. I also discussed with the patient that there may be a patient responsible charge related to this service. The patient expressed understanding and agreed to proceed.  Mr. tam, delisle are scheduled for a virtual visit with your provider today.    Just as we do with appointments in the office, we must obtain your consent to participate.  Your consent will be active for this visit and any virtual visit you may have with one of our providers in the next 365 days.    If you have a MyChart account, I can also send a copy of this consent to you electronically.  All virtual visits are billed to your insurance company just like a traditional visit in the office.  As this is a virtual visit, video technology does not allow for your provider to perform a traditional examination.  This may limit your provider's ability to fully assess your condition.  If your provider identifies any concerns that need to be evaluated in person or the need to arrange testing such as labs, EKG, etc, we will  make arrangements to do so.    Although advances in technology are sophisticated, we cannot ensure that it will always work on either your end or our end.  If the connection with a video visit is poor, we may have to switch to a telephone visit.  With either a video or telephone visit, we are not always able to ensure that we have a secure connection.   I need to obtain your verbal consent now.   Are you willing to proceed with your visit today?   Jonathan Burnett has provided verbal consent on 02/22/2021 for a virtual visit (video or telephone).   [name], [place] 02/22/2021  12:44 PM    History and Present Illness:  Pt calls the office today with cough and congestion that started two days ago. He took a home COVID test that was negative.  Cough This is a new problem. Episode onset: two days ago. The problem occurs every few minutes. The cough is Non-productive. Associated symptoms include nasal congestion, postnasal drip and a sore throat. Pertinent negatives include no chills, ear congestion, ear pain, fever, headaches, myalgias, shortness of breath or weight loss. He has tried rest for the symptoms.     Review of Systems  Constitutional:  Negative for chills, fever and weight loss.  HENT:  Positive for postnasal drip and sore throat. Negative for ear pain.   Respiratory:  Positive for cough. Negative for shortness of breath.   Musculoskeletal:  Negative for myalgias.  Neurological:  Negative for headaches.    Observations/Objective:  No SOB or distress noted, nasal congestion noted.  Assessment and Plan: 1. Flu-like symptoms Force fluids Rest Tylenol Good hand hygiene  Call if symptoms worsen or do not improve  - cetirizine (ZYRTEC) 10 MG tablet; Take 1 tablet (10 mg total) by mouth daily as needed for allergies.  Dispense: 90 tablet; Refill: 1 - oseltamivir (TAMIFLU) 75 MG capsule; Take 1 capsule (75 mg total) by mouth 2 (two) times daily.  Dispense: 10 capsule; Refill: 0 -  fluticasone (FLONASE) 50 MCG/ACT nasal spray; Place 2 sprays into both nostrils daily.  Dispense: 16 g; Refill: 6    I discussed the assessment and treatment plan with the patient. The patient was provided an opportunity to ask questions and all were answered. The patient agreed with the plan and demonstrated an understanding of the instructions.   The patient was advised to call back or seek an in-person evaluation if the symptoms worsen or if the condition fails to improve as anticipated.  The above assessment and management plan was discussed with the patient. The patient verbalized understanding of and has agreed to the management plan. Patient is aware to call the clinic if symptoms persist or worsen. Patient is aware when to return to the clinic for a follow-up visit. Patient educated on when it is appropriate to go to the emergency department.   Time call ended:  12:52 AM   I provided 12 minutes of  non face-to-face time during this encounter.    [name], FNP

## 2021-04-29 DIAGNOSIS — R29898 Other symptoms and signs involving the musculoskeletal system: Secondary | ICD-10-CM | POA: Diagnosis not present

## 2021-04-29 DIAGNOSIS — R296 Repeated falls: Secondary | ICD-10-CM | POA: Diagnosis not present

## 2021-04-29 DIAGNOSIS — W19XXXA Unspecified fall, initial encounter: Secondary | ICD-10-CM | POA: Diagnosis not present

## 2021-04-29 DIAGNOSIS — R531 Weakness: Secondary | ICD-10-CM | POA: Diagnosis not present

## 2021-04-29 DIAGNOSIS — Z886 Allergy status to analgesic agent status: Secondary | ICD-10-CM | POA: Diagnosis not present

## 2021-05-01 ENCOUNTER — Ambulatory Visit (INDEPENDENT_AMBULATORY_CARE_PROVIDER_SITE_OTHER): Payer: Medicare HMO | Admitting: Family Medicine

## 2021-05-01 ENCOUNTER — Encounter: Payer: Self-pay | Admitting: Family Medicine

## 2021-05-01 VITALS — BP 135/89 | HR 100 | Temp 98.4°F | Ht 68.0 in | Wt 145.0 lb

## 2021-05-01 DIAGNOSIS — R29898 Other symptoms and signs involving the musculoskeletal system: Secondary | ICD-10-CM | POA: Diagnosis not present

## 2021-05-01 DIAGNOSIS — W19XXXD Unspecified fall, subsequent encounter: Secondary | ICD-10-CM

## 2021-05-01 DIAGNOSIS — Z9181 History of falling: Secondary | ICD-10-CM

## 2021-05-01 NOTE — Progress Notes (Signed)
BP 135/89    Pulse 100    Temp 98.4 F (36.9 C) (Temporal)    Ht 5\' 8"  (1.727 m)    Wt 145 lb (65.8 kg)    SpO2 95%    BMI 22.05 kg/m    Subjective:   Patient ID: Jonathan Burnett, male    DOB: 1951-03-24, 71 y.o.   MRN: [id number]  HPI: Jonathan Burnett is a 71 y.o. male presenting on 05/01/2021 for Follow-up, Leg Pain, and Fall   HPI Patient is coming in with complaints of right arm and leg weakness.  When asked when it began he said he has been having it for months but he says it got worse over the past month or 2 but he does not know exactly.  Per his wife who is here with him says that she he has been falling a lot more and he says his right side just gives out.  He says he is weak on his right arm and his right leg.  He did go to the emergency department last week and had a CT scan of his head which did not show anything.  He just feels like his right side is giving out.  Relevant past medical, surgical, family and social history reviewed and updated as indicated. Interim medical history since our last visit reviewed. Allergies and medications reviewed and updated.  Review of Systems  Constitutional:  Negative for chills and fever.  Eyes:  Negative for visual disturbance.  Respiratory:  Negative for shortness of breath and wheezing.   Cardiovascular:  Negative for chest pain and leg swelling.  Musculoskeletal:  Positive for gait problem. Negative for back pain.  Skin:  Negative for rash.  Neurological:  Positive for weakness. Negative for dizziness and light-headedness.  All other systems reviewed and are negative.  Per HPI unless specifically indicated above   Allergies as of 05/01/2021       Reactions   Aspirin    GI upset/nausea        Medication List        Accurate as of May 01, 2021  3:08 PM. If you have any questions, ask your nurse or doctor.          STOP taking these medications    budesonide-formoterol 80-4.5 MCG/ACT inhaler Commonly known as:  SYMBICORT Stopped by: Worthy Rancher, MD   cetirizine 10 MG tablet Commonly known as: ZYRTEC Stopped by: Worthy Rancher, MD   fluticasone 50 MCG/ACT nasal spray Commonly known as: FLONASE Stopped by: Worthy Rancher, MD   oseltamivir 75 MG capsule Commonly known as: Tamiflu Stopped by: Fransisca Kaufmann Maurio Baize, MD       TAKE these medications    prednisoLONE acetate 1 % ophthalmic suspension Commonly known as: PRED FORTE SMARTSIG:1 Drop(s) In Eye(s) Daily PRN         Objective:   BP 135/89    Pulse 100    Temp 98.4 F (36.9 C) (Temporal)    Ht 5\' 8"  (1.727 m)    Wt 145 lb (65.8 kg)    SpO2 95%    BMI 22.05 kg/m   Wt Readings from Last 3 Encounters:  05/01/21 145 lb (65.8 kg)  01/09/21 143 lb (64.9 kg)  11/29/20 141 lb (64 kg)    Physical Exam Vitals and nursing note reviewed.  Constitutional:      General: He is not in acute distress.    Appearance: He is well-developed. He is not diaphoretic.  Eyes:     General: No scleral icterus.       Right eye: No discharge.     Conjunctiva/sclera: Conjunctivae normal.     Pupils: Pupils are equal, round, and reactive to light.  Neck:     Thyroid: No thyromegaly.  Cardiovascular:     Rate and Rhythm: Normal rate and regular rhythm.     Heart sounds: Normal heart sounds. No murmur heard. Pulmonary:     Effort: Pulmonary effort is normal. No respiratory distress.     Breath sounds: Normal breath sounds. No wheezing.  Musculoskeletal:        General: Normal range of motion.     Cervical back: Neck supple.  Lymphadenopathy:     Cervical: No cervical adenopathy.  Skin:    General: Skin is warm and dry.     Findings: No rash.  Neurological:     Mental Status: He is alert and oriented to person, place, and time.     Coordination: Coordination normal.  Psychiatric:        Behavior: Behavior normal.      Assessment & Plan:   Problem List Items Addressed This Visit   None Visit Diagnoses     Fall,  subsequent encounter    -  Primary   Relevant Orders   MR Brain Wo Contrast   Right leg weakness       Relevant Orders   MR Brain Wo Contrast       There is a possibility that patient has had a CVA, CT scan and emergency department visit were normal except for the weakness on his right arm and leg.  He says this may have been going on longer but just worsened recently.  Per wife he has been having falls very much more frequently over the past week or 2 and has been very unstable.  He says his right side is giving out really easy so we will order the MRI of the brain to rule out any thrombotic event. Follow up plan: Return if symptoms worsen or fail to improve.  Counseling provided for all of the vaccine components Orders Placed This Encounter  Procedures   MR Brain McKenzie Jere Bostrom, MD [facility] 05/01/2021, 3:08 PM

## 2021-05-02 ENCOUNTER — Other Ambulatory Visit: Payer: Self-pay

## 2021-05-02 ENCOUNTER — Ambulatory Visit (HOSPITAL_COMMUNITY)
Admission: RE | Admit: 2021-05-02 | Discharge: 2021-05-02 | Disposition: A | Discharge status: Home / Self Care | Payer: Medicare HMO | Source: Ambulatory Visit | Attending: Family Medicine | Admitting: Family Medicine

## 2021-05-02 DIAGNOSIS — I6782 Cerebral ischemia: Secondary | ICD-10-CM | POA: Insufficient documentation

## 2021-05-02 DIAGNOSIS — R531 Weakness: Secondary | ICD-10-CM | POA: Diagnosis not present

## 2021-05-02 DIAGNOSIS — W19XXXD Unspecified fall, subsequent encounter: Secondary | ICD-10-CM | POA: Insufficient documentation

## 2021-05-02 DIAGNOSIS — R29898 Other symptoms and signs involving the musculoskeletal system: Secondary | ICD-10-CM | POA: Diagnosis not present

## 2021-05-02 DIAGNOSIS — G9389 Other specified disorders of brain: Secondary | ICD-10-CM | POA: Diagnosis not present

## 2021-05-03 ENCOUNTER — Other Ambulatory Visit: Payer: Self-pay | Admitting: Family Medicine

## 2021-05-03 ENCOUNTER — Telehealth: Payer: Self-pay | Admitting: Family Medicine

## 2021-05-03 DIAGNOSIS — M899 Disorder of bone, unspecified: Secondary | ICD-10-CM

## 2021-05-03 NOTE — Telephone Encounter (Signed)
Dr. Warrick Parisian has addressed this.

## 2021-05-03 NOTE — Progress Notes (Unsigned)
Attempted to call patient and left a voicemail to call back but were not able to discuss with them, did place hematology referral because of lesion in the frontal region of his skull which is concerning Caryl Pina, MD [facility] 05/03/2021, 8:09 AM

## 2021-05-07 ENCOUNTER — Encounter (HOSPITAL_COMMUNITY): Payer: Self-pay

## 2021-05-07 NOTE — Progress Notes (Signed)
Introductory phone call placed to patient, his wife answered the phone. Introduced myself and explained my role in the patient's care. I provided my contact information and encouraged them to call with questions or concerns. New patient appt date and time provided along with directions and visitor policy. Wife verbalized understanding.

## 2021-05-14 ENCOUNTER — Inpatient Hospital Stay (HOSPITAL_COMMUNITY): Payer: Medicare HMO

## 2021-05-14 ENCOUNTER — Inpatient Hospital Stay (HOSPITAL_COMMUNITY): Payer: Medicare HMO | Attending: Hematology | Admitting: Hematology

## 2021-05-14 ENCOUNTER — Encounter (HOSPITAL_COMMUNITY): Payer: Self-pay | Admitting: Hematology

## 2021-05-14 ENCOUNTER — Other Ambulatory Visit: Payer: Self-pay

## 2021-05-14 VITALS — BP 120/80 | HR 77 | Temp 97.6°F | Resp 18 | Ht 72.0 in | Wt 145.5 lb

## 2021-05-14 DIAGNOSIS — Z800 Family history of malignant neoplasm of digestive organs: Secondary | ICD-10-CM | POA: Diagnosis not present

## 2021-05-14 DIAGNOSIS — R296 Repeated falls: Secondary | ICD-10-CM | POA: Insufficient documentation

## 2021-05-14 DIAGNOSIS — M899 Disorder of bone, unspecified: Secondary | ICD-10-CM | POA: Diagnosis not present

## 2021-05-14 DIAGNOSIS — Z720 Tobacco use: Secondary | ICD-10-CM | POA: Diagnosis not present

## 2021-05-14 DIAGNOSIS — R29898 Other symptoms and signs involving the musculoskeletal system: Secondary | ICD-10-CM | POA: Diagnosis not present

## 2021-05-14 DIAGNOSIS — Z801 Family history of malignant neoplasm of trachea, bronchus and lung: Secondary | ICD-10-CM | POA: Insufficient documentation

## 2021-05-14 LAB — LACTATE DEHYDROGENASE: LDH: 123 U/L (ref 98–192)

## 2021-05-14 NOTE — Patient Instructions (Addendum)
[place] at [facility] Discharge Instructions  You were seen and examined today by Dr. Delton Coombes. Dr. Delton Coombes is a hematologist and medical oncologist, meaning that he specializes in the management of both blood disorders and cancer diagnoses. Dr. Delton Coombes discussed your past medical history, family history of blood disorders/cancers, and the events that led to you being here today.  You were referred to Dr. Delton Coombes due to an abnormal brain MRI. There was a lesion noted on your skull that is concerning for cancer. The scan was done without contrast so it is not entirely clear.  Dr. Delton Coombes has recommended lab work today as well as further imaging to see if there is any further concern for cancer elsewhere in your body.  Dr. Delton Coombes has recommended a PET scan. A PET scan is a specialized CT scan that illuminates where there is cancer present in the body. Dr. Delton Coombes also recommends MRI of the spine due to your falls and weakness.  Follow-up as scheduled.   Thank you for choosing [place] at [facility] to provide your oncology and hematology care.  To afford each patient quality time with our provider, please arrive at least 15 minutes before your scheduled appointment time.   If you have a lab appointment with the [facility] please come in thru the Main Entrance and check in at the main information desk.  You need to re-schedule your appointment should you arrive 10 or more minutes late.  We strive to give you quality time with our providers, and arriving late affects you and other patients whose appointments are after yours.  Also, if you no show three or more times for appointments you may be dismissed from the clinic at the providers discretion.     Again, thank you for choosing [facility].  Our hope is that these requests will decrease the amount of time that you wait before being seen by our physicians.        _____________________________________________________________  Should you have questions after your visit to [facility], please contact our office at [phone] and follow the prompts.  Our office hours are 8:00 a.m. and 4:30 p.m. Monday - Friday.  Please note that voicemails left after 4:00 p.m. may not be returned until the following business day.  We are closed weekends and major holidays.  You do have access to a nurse 24-7, just call the main number to the clinic [phone] and do not press any options, hold on the line and a nurse will answer the phone.    For prescription refill requests, have your pharmacy contact our office and allow 72 hours.    Due to Covid, you will need to wear a mask upon entering the hospital. If you do not have a mask, a mask will be given to you at the Main Entrance upon arrival. For doctor visits, patients may have 1 support person age 36 or older with them. For treatment visits, patients can not have anyone with them due to social distancing guidelines and our immunocompromised population.

## 2021-05-14 NOTE — Progress Notes (Signed)
[facility] [address], [place] [address]   CLINIC:  Medical Oncology/Hematology  CONSULT NOTE  Patient Care Team: Dettinger, Fransisca Kaufmann, MD as PCP - General (Family Medicine) Pieter Partridge, DO as Consulting Physician (Neurology) Derek Jack, MD as Medical Oncologist (Medical Oncology) Brien Mates, RN as Oncology Nurse Navigator (Medical Oncology)  CHIEF COMPLAINTS/PURPOSE OF CONSULTATION:  Evaluation of left frontal skull lesion  HISTORY OF PRESENTING ILLNESS:  Jonathan Burnett 71 y.o. male is here because of evaluation of left frontal skull lesion, at the request of Dr. Warrick Parisian.  Today he reports feeling good. He reports intermittent weakness in his right leg and right side which has resulted in multiple falls over the past month; he reports that his right knee "just gives out". He denies light headedness and dizziness. He denies weight loss, fevers, and night sweats. His appetite is good. He reports a herpes infection present in his eyes which is chronic. He denies tingling/numbness. He reports occasional lower back pain. He denies pain in his right leg and knees. He denies history of CVA and MI. He denies ankle swellings.   He currently lives at home with his wife. He previously operated machinery, and he denies chemical exposure. He currently smokes 1 cigarette a day, and previously he smoked 1 ppd for 43 years. His brother had stomach cancer, and his father had lung cancer.  MEDICAL HISTORY:  Past Medical History:  Diagnosis Date   Anemia    Blood transfusion without reported diagnosis    with appendix rupture    Colon polyp    Diverticulosis    ED (erectile dysfunction)    GERD (gastroesophageal reflux disease)    diet controlled   Hyperlipidemia    Hypoglycemia    Wears dentures full   Wears glasses     SURGICAL HISTORY: Past Surgical History:  Procedure Laterality Date   APPENDECTOMY     CHOLECYSTECTOMY     COLONOSCOPY      COLONOSCOPY WITH PROPOFOL N/A 02/28/2019   Procedure: COLONOSCOPY WITH PROPOFOL;  Surgeon: Irving Copas., MD;  Location: [facility];  Service: Gastroenterology;  Laterality: N/A;   COLONOSCOPY WITH PROPOFOL N/A 10/24/2019   Procedure: COLONOSCOPY WITH PROPOFOL;  Surgeon: Rush Landmark Telford Nab., MD;  Location: [facility];  Service: Gastroenterology;  Laterality: N/A;   ENDOSCOPIC MUCOSAL RESECTION N/A 02/28/2019   Procedure: ENDOSCOPIC MUCOSAL RESECTION;  Surgeon: Rush Landmark Telford Nab., MD;  Location: [facility];  Service: Gastroenterology;  Laterality: N/A;   HEMOSTASIS CLIP PLACEMENT  02/28/2019   Procedure: HEMOSTASIS CLIP PLACEMENT;  Surgeon: Irving Copas., MD;  Location: [facility];  Service: Gastroenterology;;   POLYPECTOMY     POLYPECTOMY  10/24/2019   Procedure: POLYPECTOMY;  Surgeon: Irving Copas., MD;  Location: [facility];  Service: Gastroenterology;;   SUBMUCOSAL LIFTING INJECTION  02/28/2019   Procedure: SUBMUCOSAL LIFTING INJECTION;  Surgeon: Irving Copas., MD;  Location: [facility];  Service: Gastroenterology;;    SOCIAL HISTORY: Social History   Socioeconomic History   Marital status: Married    Spouse name: Inez Catalina   Number of children: 4   Years of education: 10   Highest education level: 9th grade  Occupational History   Occupation: English as a second language teacher: [employer]    Comment: Retired  Tobacco Use   Smoking status: Never   Smokeless tobacco: Never  Vaping Use   Vaping Use: Never used  Substance and Sexual Activity   Alcohol use: No  Drug use: No   Sexual activity: Yes  Other Topics Concern   Not on file  Social History Narrative   Patient is right-handed. He lives with his wife in a two level home, Restaurant manager, fast food on the first floor.      Enjoys being outdoors. Has one pet dog.    Social Determinants of Health   Financial Resource Strain: Not on file  Food Insecurity: Not on file  Transportation  Needs: Not on file  Physical Activity: Not on file  Stress: Not on file  Social Connections: Not on file  Intimate Partner Violence: Not on file    FAMILY HISTORY: Family History  Problem Relation Age of Onset   Hypertension Father    Lung cancer Father    Stomach cancer Brother    Healthy Sister    Healthy Daughter    Healthy Brother    Healthy Brother    Healthy Brother    Healthy Sister    Healthy Daughter    Colon polyps Neg Hx    Colon cancer Neg Hx    Esophageal cancer Neg Hx    Rectal cancer Neg Hx    Inflammatory bowel disease Neg Hx    Liver disease Neg Hx    Pancreatic cancer Neg Hx     ALLERGIES:  is allergic to aspirin.  MEDICATIONS:  Current Outpatient Medications  Medication Sig Dispense Refill   prednisoLONE acetate (PRED FORTE) 1 % ophthalmic suspension SMARTSIG:1 Drop(s) In Eye(s) Daily PRN     No current facility-administered medications for this visit.    REVIEW OF SYSTEMS:   Review of Systems  Constitutional:  Negative for appetite change and fatigue.  Musculoskeletal:  Positive for back pain (occasional - lower).  Neurological:  Positive for extremity weakness (R side). Negative for dizziness, light-headedness and numbness.  All other systems reviewed and are negative.   PHYSICAL EXAMINATION: ECOG PERFORMANCE STATUS: 1 - Symptomatic but completely ambulatory  Vitals:   05/14/21 0820  BP: 120/80  Pulse: 77  Resp: 18  Temp: 97.6 F (36.4 C)  SpO2: 100%   Filed Weights   05/14/21 0820  Weight: 145 lb 8.1 oz (66 kg)   Physical Exam Vitals reviewed.  Constitutional:      Appearance: Normal appearance.  Cardiovascular:     Rate and Rhythm: Normal rate and regular rhythm.     Pulses: Normal pulses.     Heart sounds: Normal heart sounds.  Pulmonary:     Effort: Pulmonary effort is normal.     Breath sounds: Normal breath sounds.  Musculoskeletal:     Right lower leg: No edema.     Left lower leg: No edema.  Lymphadenopathy:      Upper Body:     Right upper body: No supraclavicular, axillary or pectoral adenopathy.     Left upper body: No supraclavicular, axillary or pectoral adenopathy.     Lower Body: No right inguinal adenopathy. No left inguinal adenopathy.  Neurological:     General: No focal deficit present.     Mental Status: He is alert and oriented to person, place, and time.     Motor: Motor function is intact. No weakness.  Psychiatric:        Mood and Affect: Mood normal.        Behavior: Behavior normal.     LABORATORY DATA:  I have reviewed the data as listed CBC Latest Ref Rng & Units 10/12/2020 04/19/2020 10/13/2019  WBC 3.4 - 10.8 x10E3/uL 7.8  9.0 9.9  Hemoglobin 13.0 - 17.7 g/dL 15.2 14.7 14.8  Hematocrit 37.5 - 51.0 % 44.9 43.3 44.1  Platelets 150 - 450 x10E3/uL 229 216 250   CMP Latest Ref Rng & Units 10/12/2020 04/19/2020 10/13/2019  Glucose 65 - 99 mg/dL 84 89 83  BUN 8 - 27 mg/dL _0 Creatinine 0.76 - 1.27 mg/dL 0.98 1.01 1.07  Sodium 134 - 144 mmol/L 138 140 139  Potassium 3.5 - 5.2 mmol/L 4.7 4.0 4.7  Chloride 96 - 106 mmol/L 104 105 105  CO2 20 - 29 mmol/L _1 Calcium 8.6 - 10.2 mg/dL 9.6 9.6 9.5  Total Protein 6.0 - 8.5 g/dL 6.9 7.2 6.7  Total Bilirubin 0.0 - 1.2 mg/dL 0.3 0.4 0.4  Alkaline Phos 44 - 121 IU/L 84 88 75  AST 0 - 40 IU/L _2 ALT 0 - 44 IU/L _3 RADIOGRAPHIC STUDIES: I have personally reviewed the radiological images as listed and agreed with the findings in the report. MR Brain Wo Contrast  Result Date: 05/02/2021 CLINICAL DATA:  Neuro deficit, acute, stroke suspected. Fall. Right leg weakness. EXAM: MRI HEAD WITHOUT CONTRAST TECHNIQUE: Multiplanar, multiecho pulse sequences of the brain and surrounding structures were obtained without intravenous contrast. COMPARISON:  Head CT 04/29/2021 FINDINGS: Brain: There is no evidence of an acute infarct, intracranial hemorrhage, mass, midline shift, or extra-axial fluid collection. The ventricles and  sulci are within normal limits for age. A cavum septum pellucidum et vergae is incidentally noted. Scattered punctate T2 hyperintensities in the cerebral white matter bilaterally are nonspecific but compatible with mild chronic small vessel ischemic disease. One or two tiny chronic cerebellar infarcts are questioned. Vascular: Major intracranial vascular flow voids are preserved. Skull and upper cervical spine: 2.5 cm T1 hypointense, FLAIR hyperintense focus in the left frontal skull without definite correlate on CT. Sinuses/Orbits: Unremarkable orbits. Mild mucosal thickening in the paranasal sinuses. Clear mastoid air cells. Other: None. IMPRESSION: 1. No acute intracranial abnormality. 2. Mild chronic small vessel ischemic disease. 3. 2.5 cm left frontal skull lesion, indeterminate. If the patient has a history of malignancy, nuclear medicine bone scan is recommended to evaluate for metastatic disease. Evaluation for multiple myeloma could also be considered. Electronically Signed   By: Logan Bores M.D.   On: 05/02/2021 12:14    ASSESSMENT:  Left frontal skull lesion: - Reports 1 month history of intermittent falls with right leg giving out.  Also reports weakness in the right upper extremity during the same time.  Denies any dizziness when falls. - CT scan of the head [facility] on 04/29/2021-unremarkable. - Labs include normal CMP with calcium 9.4 and creatinine 1.02.  LFTs were normal.  CBC was grossly normal. - MRI of the brain without contrast on 119 2023-2.5 cm left frontal skull lesion, indeterminate.  T1 hypointense and FLAIR hyperintense focus.  No correlation on prior CT. - Colonoscopy on 11/29/2020 with tubular adenoma removed.   Social/family history: - Lives at home with his wife.  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  No chemical exposure. - Current active smoker, 1 pack/day for 43 years.  He is currently smoking half a cigarette daily. - Brother had stomach cancer and father had lung  cancer.   PLAN:  Left frontal skull lesion: - We have reviewed images of the scans with the patient and his wife in detail. - Recommend work-up for multiple myeloma with SPEP, immunofixation and free  light chains and LDH. - Muscle strength today was 5 out of 5 in the upper and lower extremities. - Given his recurrent falls and right lower extremity weakness, recommend MRI of the thoracic and lumbar spine with and without contrast. - Also recommend PET CT scan to further evaluate frontal skull lesion.  If it is hypermetabolic, will consider biopsy. - RTC after scans to discuss results and further plan.   All questions were answered. The patient knows to call the clinic with any problems, questions or concerns.   Derek Jack, MD, 05/14/21 8:51 AM  [facility] [phone]   I, Thana Ates, am acting as a scribe for Dr. Derek Jack.  I, Derek Jack MD, have reviewed the above documentation for accuracy and completeness, and I agree with the above.

## 2021-05-15 LAB — PROTEIN ELECTROPHORESIS, SERUM
A/G Ratio: 1.4 (ref 0.7–1.7)
Albumin ELP: 4 g/dL (ref 2.9–4.4)
Alpha-1-Globulin: 0.3 g/dL (ref 0.0–0.4)
Alpha-2-Globulin: 0.7 g/dL (ref 0.4–1.0)
Beta Globulin: 0.8 g/dL (ref 0.7–1.3)
Gamma Globulin: 1.1 g/dL (ref 0.4–1.8)
Globulin, Total: 2.8 g/dL (ref 2.2–3.9)
Total Protein ELP: 6.8 g/dL (ref 6.0–8.5)

## 2021-05-15 LAB — KAPPA/LAMBDA LIGHT CHAINS
Kappa free light chain: 27.4 mg/L — ABNORMAL HIGH (ref 3.3–19.4)
Kappa, lambda light chain ratio: 1.69 — ABNORMAL HIGH (ref 0.26–1.65)
Lambda free light chains: 16.2 mg/L (ref 5.7–26.3)

## 2021-05-17 LAB — IMMUNOFIXATION ELECTROPHORESIS
IgA: 152 mg/dL (ref 61–437)
IgG (Immunoglobin G), Serum: 1025 mg/dL (ref 603–1613)
IgM (Immunoglobulin M), Srm: 114 mg/dL (ref 15–143)
Total Protein ELP: 6.9 g/dL (ref 6.0–8.5)

## 2021-05-21 ENCOUNTER — Ambulatory Visit (HOSPITAL_COMMUNITY): Payer: Medicare HMO

## 2021-05-23 ENCOUNTER — Other Ambulatory Visit: Payer: Self-pay

## 2021-05-23 ENCOUNTER — Encounter (HOSPITAL_COMMUNITY)
Admission: RE | Admit: 2021-05-23 | Discharge: 2021-05-23 | Disposition: A | Discharge status: Home / Self Care | Payer: Medicare HMO | Source: Ambulatory Visit | Attending: Hematology | Admitting: Hematology

## 2021-05-23 DIAGNOSIS — M899 Disorder of bone, unspecified: Secondary | ICD-10-CM | POA: Insufficient documentation

## 2021-05-23 DIAGNOSIS — R930 Abnormal findings on diagnostic imaging of skull and head, not elsewhere classified: Secondary | ICD-10-CM | POA: Diagnosis not present

## 2021-05-23 MED ORDER — FLUDEOXYGLUCOSE F - 18 (FDG) INJECTION
7.6800 | Freq: Once | INTRAVENOUS | Status: AC | PRN
  Administered 2021-05-23: 7.68 via INTRAVENOUS

## 2021-05-29 ENCOUNTER — Inpatient Hospital Stay (HOSPITAL_COMMUNITY): Payer: Medicare HMO | Attending: Hematology | Admitting: Hematology

## 2021-05-29 ENCOUNTER — Other Ambulatory Visit: Payer: Self-pay

## 2021-05-29 VITALS — BP 118/72 | HR 92 | Temp 97.3°F | Resp 20 | Ht 72.0 in | Wt 145.0 lb

## 2021-05-29 DIAGNOSIS — M899 Disorder of bone, unspecified: Secondary | ICD-10-CM | POA: Insufficient documentation

## 2021-05-29 DIAGNOSIS — R531 Weakness: Secondary | ICD-10-CM | POA: Insufficient documentation

## 2021-05-29 DIAGNOSIS — R29898 Other symptoms and signs involving the musculoskeletal system: Secondary | ICD-10-CM

## 2021-05-29 DIAGNOSIS — Z8719 Personal history of other diseases of the digestive system: Secondary | ICD-10-CM | POA: Insufficient documentation

## 2021-05-29 NOTE — Patient Instructions (Signed)
[place] at [facility] Discharge Instructions   You were seen and examined today by Dr. Delton Coombes.  He reviewed your lab work which is normal/stable.  There is no evidence of multiple myeloma.  You should see an orthopedic doctor for your hip and knee.  Return as scheduled in 6 months for lab work and office visit.    Thank you for choosing [place] at [facility] to provide your oncology and hematology care.  To afford each patient quality time with our provider, please arrive at least 15 minutes before your scheduled appointment time.   If you have a lab appointment with the [facility] please come in thru the Main Entrance and check in at the main information desk.  You need to re-schedule your appointment should you arrive 10 or more minutes late.  We strive to give you quality time with our providers, and arriving late affects you and other patients whose appointments are after yours.  Also, if you no show three or more times for appointments you may be dismissed from the clinic at the providers discretion.     Again, thank you for choosing [facility].  Our hope is that these requests will decrease the amount of time that you wait before being seen by our physicians.       _____________________________________________________________  Should you have questions after your visit to [facility], please contact our office at [phone] and follow the prompts.  Our office hours are 8:00 a.m. and 4:30 p.m. Monday - Friday.  Please note that voicemails left after 4:00 p.m. may not be returned until the following business day.  We are closed weekends and major holidays.  You do have access to a nurse 24-7, just call the main number to the clinic [phone] and do not press any options, hold on the line and a nurse will answer the phone.    For prescription refill requests, have your pharmacy contact our office  and allow 72 hours.    Due to Covid, you will need to wear a mask upon entering the hospital. If you do not have a mask, a mask will be given to you at the Main Entrance upon arrival. For doctor visits, patients may have 1 support person age 25 or older with them. For treatment visits, patients can not have anyone with them due to social distancing guidelines and our immunocompromised population.

## 2021-05-29 NOTE — Progress Notes (Signed)
[address] [place], [place] [address]   CLINIC:  Medical Oncology/Hematology  PCP:  Dettinger, Fransisca Kaufmann, MD [place] / [place] [address] [phone]   REASON FOR VISIT:  Follow-up for left frontal skull lesion  PRIOR THERAPY: none  NGS Results: not done  CURRENT THERAPY: under work-up  BRIEF ONCOLOGIC HISTORY:  Oncology History   No history exists.    CANCER STAGING: Cancer Staging  No matching staging information was found for the patient.  INTERVAL HISTORY:  [name], a 71 y.o. male, returns for routine follow-up of his left frontal skull lesion. Jonathan Burnett was last seen on 05/14/2021.   Today he reports feeling good. He denies recent falls, but he reports continued weakness in his right leg.    REVIEW OF SYSTEMS:  Review of Systems  Constitutional:  Negative for appetite change and fatigue.  HENT:   Positive for trouble swallowing.   Musculoskeletal:  Positive for arthralgias (5/10 shoulders).  Neurological:  Positive for numbness.  Psychiatric/Behavioral:  Positive for sleep disturbance.   All other systems reviewed and are negative.  PAST MEDICAL/SURGICAL HISTORY:  Past Medical History:  Diagnosis Date   Anemia    Blood transfusion without reported diagnosis    with appendix rupture    Colon polyp    Diverticulosis    ED (erectile dysfunction)    GERD (gastroesophageal reflux disease)    diet controlled   Hyperlipidemia    Hypoglycemia    Wears dentures full   Wears glasses    Past Surgical History:  Procedure Laterality Date   APPENDECTOMY     CHOLECYSTECTOMY     COLONOSCOPY     COLONOSCOPY WITH PROPOFOL N/A 02/28/2019   Procedure: COLONOSCOPY WITH PROPOFOL;  Surgeon: Irving Copas., MD;  Location: [facility];  Service: Gastroenterology;  Laterality: N/A;   COLONOSCOPY WITH PROPOFOL N/A 10/24/2019   Procedure: COLONOSCOPY WITH PROPOFOL;  Surgeon: Rush Landmark Telford Nab., MD;  Location: [place];  Service: Gastroenterology;  Laterality: N/A;   ENDOSCOPIC MUCOSAL RESECTION N/A 02/28/2019   Procedure: ENDOSCOPIC MUCOSAL RESECTION;  Surgeon: Rush Landmark Telford Nab., MD;  Location: [place] [facility];  Service: Gastroenterology;  Laterality: N/A;   HEMOSTASIS CLIP PLACEMENT  02/28/2019   Procedure: HEMOSTASIS CLIP PLACEMENT;  Surgeon: Irving Copas., MD;  Location: [place];  Service: Gastroenterology;;   POLYPECTOMY     POLYPECTOMY  10/24/2019   Procedure: POLYPECTOMY;  Surgeon: Irving Copas., MD;  Location: [facility] [facility];  Service: Gastroenterology;;   SUBMUCOSAL LIFTING INJECTION  02/28/2019   Procedure: SUBMUCOSAL LIFTING INJECTION;  Surgeon: Irving Copas., MD;  Location: [facility];  Service: Gastroenterology;;    SOCIAL HISTORY:  Social History   Socioeconomic History   Marital status: Married    Spouse name: Inez Catalina   Number of children: 4   Years of education: 10   Highest education level: 9th grade  Occupational History   Occupation: English as a second language teacher: [employer]    Comment: Retired  Tobacco Use   Smoking status: Never   Smokeless tobacco: Never  Vaping Use   Vaping Use: Never used  Substance and Sexual Activity   Alcohol use: No   Drug use: No   Sexual activity: Yes  Other Topics Concern   Not on file  Social History Narrative   Patient is right-handed. He lives with his wife in a two level home, Restaurant manager, fast food on the first floor.      Enjoys being  outdoors. Has one pet dog.    Social Determinants of Health   Financial Resource Strain: Not on file  Food Insecurity: Not on file  Transportation Needs: Not on file  Physical Activity: Not on file  Stress: Not on file  Social Connections: Not on file  Intimate Partner Violence: Not on file    FAMILY HISTORY:  Family History  Problem Relation Age of Onset   Hypertension Father    Lung cancer Father    Stomach cancer Brother    Healthy Sister    Healthy Daughter     Healthy Brother    Healthy Brother    Healthy Brother    Healthy Sister    Healthy Daughter    Colon polyps Neg Hx    Colon cancer Neg Hx    Esophageal cancer Neg Hx    Rectal cancer Neg Hx    Inflammatory bowel disease Neg Hx    Liver disease Neg Hx    Pancreatic cancer Neg Hx     CURRENT MEDICATIONS:  Current Outpatient Medications  Medication Sig Dispense Refill   prednisoLONE acetate (PRED FORTE) 1 % ophthalmic suspension SMARTSIG:1 Drop(s) In Eye(s) Daily PRN     No current facility-administered medications for this visit.    ALLERGIES:  Allergies  Allergen Reactions   Aspirin     GI upset/nausea    PHYSICAL EXAM:  Performance status (ECOG): 1 - Symptomatic but completely ambulatory  Vitals:   05/29/21 1243  BP: 118/72  Pulse: 92  Resp: 20  Temp: (!) 97.3 F (36.3 C)  SpO2: 93%   Wt Readings from Last 3 Encounters:  05/29/21 145 lb (65.8 kg)  05/14/21 145 lb 8.1 oz (66 kg)  05/01/21 145 lb (65.8 kg)   Physical Exam Vitals reviewed.  Constitutional:      Appearance: Normal appearance.  Cardiovascular:     Rate and Rhythm: Normal rate and regular rhythm.     Pulses: Normal pulses.     Heart sounds: Normal heart sounds.  Pulmonary:     Effort: Pulmonary effort is normal.     Breath sounds: Normal breath sounds.  Neurological:     General: No focal deficit present.     Mental Status: He is alert and oriented to person, place, and time.  Psychiatric:        Mood and Affect: Mood normal.        Behavior: Behavior normal.     LABORATORY DATA:  I have reviewed the labs as listed.  CBC Latest Ref Rng & Units 10/12/2020 04/19/2020 10/13/2019  WBC 3.4 - 10.8 x10E3/uL 7.8 9.0 9.9  Hemoglobin 13.0 - 17.7 g/dL 15.2 14.7 14.8  Hematocrit 37.5 - 51.0 % 44.9 43.3 44.1  Platelets 150 - 450 x10E3/uL 229 216 250   CMP Latest Ref Rng & Units 10/12/2020 04/19/2020 10/13/2019  Glucose 65 - 99 mg/dL 84 89 83  BUN 8 - 27 mg/dL $Remove'17 13 13  'rETdIZf$ Creatinine 0.76 - 1.27 mg/dL 0.98  1.01 1.07  Sodium 134 - 144 mmol/L 138 140 139  Potassium 3.5 - 5.2 mmol/L 4.7 4.0 4.7  Chloride 96 - 106 mmol/L 104 105 105  CO2 20 - 29 mmol/L $RemoveB'21 22 23  'llObIOpM$ Calcium 8.6 - 10.2 mg/dL 9.6 9.6 9.5  Total Protein 6.0 - 8.5 g/dL 6.9 7.2 6.7  Total Bilirubin 0.0 - 1.2 mg/dL 0.3 0.4 0.4  Alkaline Phos 44 - 121 IU/L 84 88 75  AST 0 - 40 IU/L 17 16 16  ALT 0 - 44 IU/L $Remov'10 11 14    'pbXlBD$ DIAGNOSTIC IMAGING:  I have independently reviewed the scans and discussed with the patient. MR Brain Wo Contrast  Result Date: 05/02/2021 CLINICAL DATA:  Neuro deficit, acute, stroke suspected. Fall. Right leg weakness. EXAM: MRI HEAD WITHOUT CONTRAST TECHNIQUE: Multiplanar, multiecho pulse sequences of the brain and surrounding structures were obtained without intravenous contrast. COMPARISON:  Head CT 04/29/2021 FINDINGS: Brain: There is no evidence of an acute infarct, intracranial hemorrhage, mass, midline shift, or extra-axial fluid collection. The ventricles and sulci are within normal limits for age. A cavum septum pellucidum et vergae is incidentally noted. Scattered punctate T2 hyperintensities in the cerebral white matter bilaterally are nonspecific but compatible with mild chronic small vessel ischemic disease. One or two tiny chronic cerebellar infarcts are questioned. Vascular: Major intracranial vascular flow voids are preserved. Skull and upper cervical spine: 2.5 cm T1 hypointense, FLAIR hyperintense focus in the left frontal skull without definite correlate on CT. Sinuses/Orbits: Unremarkable orbits. Mild mucosal thickening in the paranasal sinuses. Clear mastoid air cells. Other: None. IMPRESSION: 1. No acute intracranial abnormality. 2. Mild chronic small vessel ischemic disease. 3. 2.5 cm left frontal skull lesion, indeterminate. If the patient has a history of malignancy, nuclear medicine bone scan is recommended to evaluate for metastatic disease. Evaluation for multiple myeloma could also be considered.  Electronically Signed   By: Logan Bores M.D.   On: 05/02/2021 12:14   NM PET Image Initial (PI) Whole Body  Result Date: 05/24/2021 CLINICAL DATA:  Initial treatment strategy for skull lesion, possible multiple myeloma EXAM: NUCLEAR MEDICINE PET WHOLE BODY TECHNIQUE: 7.7 mCi F-18 FDG was injected intravenously. Full-ring PET imaging was performed from the head to foot after the radiotracer. CT data was obtained and used for attenuation correction and anatomic localization. Fasting blood glucose: 99 mg/dl COMPARISON:  MRI brain 05/02/2021 FINDINGS: Mediastinal blood pool activity: SUV max 1.9 HEAD/NECK: Incidental physiologic activity in the longus colli muscles. Incidental CT findings: Chronic bilateral maxillary and ethmoid sinusitis. Bilateral common carotid atherosclerotic calcification. CHEST: No significant abnormal hypermetabolic activity in this region. Incidental CT findings: Coronary, aortic arch, and branch vessel atherosclerotic vascular disease. Centrilobular emphysema. ABDOMEN/PELVIS: Physiologic activity in bowel. Incidental CT findings: Atherosclerosis is present, including aortoiliac atherosclerotic disease. Cholecystectomy. 3 mm right kidney upper pole nonobstructive renal calculus. Sigmoid colon diverticulosis. Prostatomegaly. SKELETON: The calvarial lesion of concern on MRI is not hypermetabolic, and poorly appreciable on the dedicated CT images. There is no lytic lesion in this vicinity. No hypermetabolic skeletal lesion is observed. Incidental CT findings: none EXTREMITIES: No significant abnormal hypermetabolic activity in this region. Incidental CT findings: none IMPRESSION: 1. The calvarial lesion of concern on prior MRI is not hypermetabolic and not readily visible on the CT images. No other bony lesion is identified. 2. Other imaging findings of potential clinical significance: Aortic Atherosclerosis (ICD10-I70.0). Coronary atherosclerosis. Emphysema (ICD10-J43.9). Peripheral  atherosclerosis. Chronic paranasal sinusitis. Sigmoid colon diverticulosis. Prostatomegaly. Nonobstructive right nephrolithiasis. Electronically Signed   By: Van Clines M.D.   On: 05/24/2021 15:49     ASSESSMENT:  Left frontal skull lesion: - Reports 1 month history of intermittent falls with right leg giving out.  Also reports weakness in the right upper extremity during the same time.  Denies any dizziness when falls. - CT [facility] head [facility] on 04/29/2021-unremarkable. - Labs include normal CMP with calcium 9.4 and creatinine 1.02.  LFTs were normal.  CBC was grossly normal. - MRI of the  brain without contrast on 119 2023-2.5 cm left frontal skull lesion, indeterminate.  T1 hypointense and FLAIR hyperintense focus.  No correlation on prior CT. - Colonoscopy on 11/29/2020 with tubular adenoma removed.    Social/family history: - Lives at home with his wife.  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  No chemical exposure. - Current active smoker, 1 pack/day for 43 years.  He is currently smoking half a cigarette daily. - Brother had stomach cancer and father had lung cancer.   PLAN:  Left frontal skull lesion (PET negative): - We have reviewed results of labs from 05/14/2021.  SPEP was negative.  Immunofixation was normal.  Free kappa light chains are slightly elevated at 27.4 with ratio of 1.69. - We reviewed PET scan images from 05/23/2021 which did not show hypermetabolism in the calvarial lesion seen on MRI.  No other hypermetabolic bony lesions or soft tissue lesions seen.  Other nonmalignant findings were discussed. - He still reports that his right knee gives out.  I have recommended him to see orthopedics. - As he had slightly elevated kappa light chains and ratio, I have recommended follow-up in 6 months with repeat SPEP, immunofixation and free light chains along with routine labs.   Orders placed this encounter:  No orders of the defined types were placed in this  encounter.    Derek Jack, MD [place] [phone]   I, Thana Ates, am acting as a scribe for Dr. Derek Jack.  I, Derek Jack MD, have reviewed the above documentation for accuracy and completeness, and I agree with the above.

## 2021-06-06 ENCOUNTER — Telehealth: Payer: Self-pay | Admitting: Family Medicine

## 2021-06-06 NOTE — Telephone Encounter (Signed)
Left message for patient to call back and schedule Medicare Annual Wellness Visit (AWV) to be completed by video or phone.   Last AWV: 05/07/2020  Please schedule at anytime with [place]  45 minute appointment  Any questions, please contact me at [phone]

## 2021-06-10 DIAGNOSIS — H18523 Epithelial (juvenile) corneal dystrophy, bilateral: Secondary | ICD-10-CM | POA: Diagnosis not present

## 2021-06-10 DIAGNOSIS — H2513 Age-related nuclear cataract, bilateral: Secondary | ICD-10-CM | POA: Diagnosis not present

## 2021-06-10 DIAGNOSIS — B0052 Herpesviral keratitis: Secondary | ICD-10-CM | POA: Diagnosis not present

## 2021-06-27 ENCOUNTER — Ambulatory Visit (INDEPENDENT_AMBULATORY_CARE_PROVIDER_SITE_OTHER): Payer: Medicare HMO

## 2021-06-27 VITALS — Wt 145.0 lb

## 2021-06-27 DIAGNOSIS — Z0000 Encounter for general adult medical examination without abnormal findings: Secondary | ICD-10-CM | POA: Diagnosis not present

## 2021-06-27 NOTE — Patient Instructions (Signed)
Jonathan Burnett , ?Thank you for taking time to come for your Medicare Wellness Visit. I appreciate your ongoing commitment to your health goals. Please review the following plan we discussed and let me know if I can assist you in the future.  ? ?Screening recommendations/referrals: ?Colonoscopy: Done 11/29/2020 - repeat in 3 years ?Recommended yearly ophthalmology/optometry visit for glaucoma screening and checkup ?Recommended yearly dental visit for hygiene and checkup ? ?Vaccinations: ?Influenza vaccine: Done 01/09/2021 - Repeat annually ?Pneumococcal vaccine: Done 05/21/2017 & 10/21/2018 ?Tdap vaccine: Done 01/06/2018 - Repeat in 10 years ?Shingles vaccine: Done 10/12/2020 - due for second dose at your next visit   ?Covid-19: done 09/06/2019 & 10/03/2019 - contact your pharmacy for additional boosters ? ?Advanced directives: Please bring a copy of your health care power of attorney and living will to the office to be added to your chart at your convenience.  ? ?Conditions/risks identified: Aim for 30 minutes of exercise or brisk walking, 6-8 glasses of water, and 5 servings of fruits and vegetables each day.  ? ?Next appointment: Follow up in one year for your annual wellness visit.  ? ?Preventive Care 18 Years and Older, Male ? ?Preventive care refers to lifestyle choices and visits with your health care provider that can promote health and wellness. ?What does preventive care include? ?A yearly physical exam. This is also called an annual well check. ?Dental exams once or twice a year. ?Routine eye exams. Ask your health care provider how often you should have your eyes checked. ?Personal lifestyle choices, including: ?Daily care of your teeth and gums. ?Regular physical activity. ?Eating a healthy diet. ?Avoiding tobacco and drug use. ?Limiting alcohol use. ?Practicing safe sex. ?Taking low doses of aspirin every day. ?Taking vitamin and mineral supplements as recommended by your health care provider. ?What happens during an  annual well check? ?The services and screenings done by your health care provider during your annual well check will depend on your age, overall health, lifestyle risk factors, and family history of disease. ?Counseling  ?Your health care provider may ask you questions about your: ?Alcohol use. ?Tobacco use. ?Drug use. ?Emotional well-being. ?Home and relationship well-being. ?Sexual activity. ?Eating habits. ?History of falls. ?Memory and ability to understand (cognition). ?Work and work Statistician. ?Screening  ?You may have the following tests or measurements: ?Height, weight, and BMI. ?Blood pressure. ?Lipid and cholesterol levels. These may be checked every 5 years, or more frequently if you are over 48 years old. ?Skin check. ?Lung cancer screening. You may have this screening every year starting at age 3 if you have a 30-pack-year history of smoking and currently smoke or have quit within the past 15 years. ?Fecal occult blood test (FOBT) of the stool. You may have this test every year starting at age 7. ?Flexible sigmoidoscopy or colonoscopy. You may have a sigmoidoscopy every 5 years or a colonoscopy every 10 years starting at age 18. ?Prostate cancer screening. Recommendations will vary depending on your family history and other risks. ?Hepatitis C blood test. ?Hepatitis B blood test. ?Sexually transmitted disease (STD) testing. ?Diabetes screening. This is done by checking your blood sugar (glucose) after you have not eaten for a while (fasting). You may have this done every 1-3 years. ?Abdominal aortic aneurysm (AAA) screening. You may need this if you are a current or former smoker. ?Osteoporosis. You may be screened starting at age 67 if you are at high risk. ?Talk with your health care provider about your test results, treatment options,  and if necessary, the need for more tests. ?Vaccines  ?Your health care provider may recommend certain vaccines, such as: ?Influenza vaccine. This is recommended  every year. ?Tetanus, diphtheria, and acellular pertussis (Tdap, Td) vaccine. You may need a Td booster every 10 years. ?Zoster vaccine. You may need this after age 57. ?Pneumococcal 13-valent conjugate (PCV13) vaccine. One dose is recommended after age 2. ?Pneumococcal polysaccharide (PPSV23) vaccine. One dose is recommended after age 23. ?Talk to your health care provider about which screenings and vaccines you need and how often you need them. ?This information is not intended to replace advice given to you by your health care provider. Make sure you discuss any questions you have with your health care provider. ?Document Released: 04/27/2015 Document Revised: 12/19/2015 Document Reviewed: 01/30/2015 ?Elsevier Interactive Patient Education ? 2017 [place]. ? ?Fall Prevention in the Home ?Falls can cause injuries. They can happen to people of all ages. There are many things you can do to make your home safe and to help prevent falls. ?What can I do on the outside of my home? ?Regularly fix the edges of walkways and driveways and fix any cracks. ?Remove anything that might make you trip as you walk through a door, such as a raised step or threshold. ?Trim any bushes or trees on the path to your home. ?Use bright outdoor lighting. ?Clear any walking paths of anything that might make someone trip, such as rocks or tools. ?Regularly check to see if handrails are loose or broken. Make sure that both sides of any steps have handrails. ?Any raised decks and porches should have guardrails on the edges. ?Have any leaves, snow, or ice cleared regularly. ?Use sand or salt on walking paths during winter. ?Clean up any spills in your garage right away. This includes oil or grease spills. ?What can I do in the bathroom? ?Use night lights. ?Install grab bars by the toilet and in the tub and shower. Do not use towel bars as grab bars. ?Use non-skid mats or decals in the tub or shower. ?If you need to sit down in the shower,  use a plastic, non-slip stool. ?Keep the floor dry. Clean up any water that spills on the floor as soon as it happens. ?Remove soap buildup in the tub or shower regularly. ?Attach bath mats securely with double-sided non-slip rug tape. ?Do not have throw rugs and other things on the floor that can make you trip. ?What can I do in the bedroom? ?Use night lights. ?Make sure that you have a light by your bed that is easy to reach. ?Do not use any sheets or blankets that are too big for your bed. They should not hang down onto the floor. ?Have a firm chair that has side arms. You can use this for support while you get dressed. ?Do not have throw rugs and other things on the floor that can make you trip. ?What can I do in the kitchen? ?Clean up any spills right away. ?Avoid walking on wet floors. ?Keep items that you use a lot in easy-to-reach places. ?If you need to reach something above you, use a strong step stool that has a grab bar. ?Keep electrical cords out of the way. ?Do not use floor polish or wax that makes floors slippery. If you must use wax, use non-skid floor wax. ?Do not have throw rugs and other things on the floor that can make you trip. ?What can I do with my stairs? ?Do not leave  any items on the stairs. ?Make sure that there are handrails on both sides of the stairs and use them. Fix handrails that are broken or loose. Make sure that handrails are as long as the stairways. ?Check any carpeting to make sure that it is firmly attached to the stairs. Fix any carpet that is loose or worn. ?Avoid having throw rugs at the top or bottom of the stairs. If you do have throw rugs, attach them to the floor with carpet tape. ?Make sure that you have a light switch at the top of the stairs and the bottom of the stairs. If you do not have them, ask someone to add them for you. ?What else can I do to help prevent falls? ?Wear shoes that: ?Do not have high heels. ?Have rubber bottoms. ?Are comfortable and fit you  well. ?Are closed at the toe. Do not wear sandals. ?If you use a stepladder: ?Make sure that it is fully opened. Do not climb a closed stepladder. ?Make sure that both sides of the stepladder are locked

## 2021-06-27 NOTE — Progress Notes (Signed)
? ?Subjective:  ? Jonathan Burnett is a 71 y.o. male who presents for Medicare Annual/Subsequent preventive examination. ? ?Virtual Visit via Telephone Note ? ?I connected with  Jonathan Burnett on 06/27/21 at 12:00 PM EDT by telephone and verified that I am speaking with the correct person using two identifiers. ? ?Location: ?Patient: Home ?Provider: [facility] ?Persons participating in the virtual visit: patient/Nurse Health Advisor ?  ?I discussed the limitations, risks, security and privacy concerns of performing an evaluation and management service by telephone and the availability of in person appointments. The patient expressed understanding and agreed to proceed. ? ?Interactive audio and video telecommunications were attempted between this nurse and patient, however failed, due to patient having technical difficulties OR patient did not have access to video capability.  We continued and completed visit with audio only. ? ?Some vital signs may be absent or patient reported.  ? ?Jonathan Borys Dionne Ano, LPN  ? ?Review of Systems    ? ?Cardiac Risk Factors include: advanced age (>49mn, >>44women);family history of premature cardiovascular disease;male gender;sedentary lifestyle;Other (see comment), Risk factor comments: COPD ? ?   ?Objective:  ?  ?Today's Vitals  ? 06/27/21 1134  ?Weight: 145 lb (65.8 kg)  ?PainSc: 9   ? ?Body mass index is 19.67 kg/m?. ? ?Advanced Directives 06/27/2021 05/29/2021 05/14/2021 07/05/2020 05/07/2020 10/24/2019 02/28/2019  ?Does Patient Have a Medical Advance Directive? No No No No Yes No No  ?Type of Advance Directive - - - - HPress photographerLiving will - -  ?Copy of [place] Chart? - - - - Yes - validated most recent copy scanned in chart (See row information) - -  ?Would patient like information on creating a medical advance directive? No - Patient declined No - Patient declined No - Patient declined - - No - Patient declined No - Patient declined  ? ? ?Current  Medications (verified) ?Outpatient Encounter Medications as of 06/27/2021  ?Medication Sig  ? prednisoLONE acetate (PRED FORTE) 1 % ophthalmic suspension SMARTSIG:1 Drop(s) In Eye(s) Daily PRN  ? ?No facility-administered encounter medications on file as of 06/27/2021.  ? ? ?Allergies (verified) ?Aspirin  ? ?History: ?Past Medical History:  ?Diagnosis Date  ? Anemia   ? Blood transfusion without reported diagnosis   ? with appendix rupture   ? Colon polyp   ? Diverticulosis   ? ED (erectile dysfunction)   ? GERD (gastroesophageal reflux disease)   ? diet controlled  ? Hyperlipidemia   ? Hypoglycemia   ? Wears dentures full  ? Wears glasses   ? ?Past Surgical History:  ?Procedure Laterality Date  ? APPENDECTOMY    ? CHOLECYSTECTOMY    ? COLONOSCOPY    ? COLONOSCOPY WITH PROPOFOL N/A 02/28/2019  ? Procedure: COLONOSCOPY WITH PROPOFOL;  Surgeon: Mansouraty, GTelford Nab, MD;  [place] [facility]  Service: Gastroenterology;  Laterality: N/A;  ? COLONOSCOPY WITH PROPOFOL N/A 10/24/2019  ? Procedure: COLONOSCOPY WITH PROPOFOL;  Surgeon: Mansouraty, GTelford Nab, MD;  [place] [facility]  Service: Gastroenterology;  Laterality: N/A;  ? ENDOSCOPIC MUCOSAL RESECTION N/A 02/28/2019  ? Procedure: ENDOSCOPIC MUCOSAL RESECTION;  Surgeon: MRush LandmarkGTelford Nab, MD;  Location[place] [facility]  Service: Gastroenterology;  Laterality: N/A;  ? HEMOSTASIS CLIP PLACEMENT  02/28/2019  ? Procedure: HEMOSTASIS CLIP PLACEMENT;  Surgeon: MIrving Copas, MD;  [place] [facility]  Service: Gastroenterology;;  ? POLYPECTOMY    ? POLYPECTOMY  10/24/2019  ? Procedure: POLYPECTOMY;  Surgeon: MIrving Copas, MD;  Location:  [facility];  Service: Gastroenterology;;  ? [place] INJECTION  02/28/2019  ? Procedure: SUBMUCOSAL LIFTING INJECTION;  Surgeon: Irving Copas., MD;  Location: [place];  Service: Gastroenterology;;  ? ?Family History  ?Problem Relation Age of Onset  ? Hypertension Father   ? Lung  cancer Father   ? Stomach cancer Brother   ? Healthy Sister   ? Healthy Daughter   ? Healthy Brother   ? Healthy Brother   ? Healthy Brother   ? Healthy Sister   ? Healthy Daughter   ? Colon polyps Neg Hx   ? Colon cancer Neg Hx   ? Esophageal cancer Neg Hx   ? Rectal cancer Neg Hx   ? Inflammatory bowel disease Neg Hx   ? Liver disease Neg Hx   ? Pancreatic cancer Neg Hx   ? ?Social History  ? ?Socioeconomic History  ? Marital status: Married  ?  Spouse name: Jonathan Burnett  ? Number of children: 4  ? Years of education: 10  ? Highest education level: 9th grade  ?Occupational History  ? Occupation: Textiles  ?  Employer: [employer]  ?  Comment: Retired  ?Tobacco Use  ? Smoking status: Never  ? Smokeless tobacco: Never  ?Vaping Use  ? Vaping Use: Never used  ?Substance and Sexual Activity  ? Alcohol use: No  ? Drug use: No  ? Sexual activity: Yes  ?Other Topics Concern  ? Not on file  ?Social History Narrative  ? Patient is right-handed. He lives with his wife in one level home  ?   ? Enjoys being outdoors. Has one pet dog.   ? ?Social Determinants of Health  ? ?Financial Resource Strain: Low Risk   ? Difficulty of Paying Living Expenses: Not very hard  ?Food Insecurity: No Food Insecurity  ? Worried About Charity fundraiser in the Last Year: Never true  ? Ran Out of Food in the Last Year: Never true  ?Transportation Needs: No Transportation Needs  ? Lack of Transportation (Medical): No  ? Lack of Transportation (Non-Medical): No  ?Physical Activity: Sufficiently Active  ? Days of Exercise per Week: 7 days  ? Minutes of Exercise per Session: 30 min  ?Stress: No Stress Concern Present  ? Feeling of Stress : Not at all  ?Social Connections: Socially Isolated  ? Frequency of Communication with Friends and Family: Once a week  ? Frequency of Social Gatherings with Friends and Family: Once a week  ? Attends Religious Services: Never  ? Active Member of Clubs or Organizations: No  ? Attends Archivist Meetings:  Never  ? Marital Status: Married  ? ? ?Tobacco Counseling ?Counseling given: Not Answered ? ? ?Clinical Intake: ? ?Pre-visit preparation completed: Yes ? ?Pain : 0-10 ?Pain Score: 9  ?Pain Type: Chronic pain ?Pain Location: Shoulder ?Pain Orientation: Right, Left ?Pain Descriptors / Indicators: Sore, Aching ?Pain Onset: More than a month ago ?Pain Frequency: Intermittent ? ?  ? ?BMI - recorded: 19.67 ?Nutritional Status: BMI of 19-24  Normal ?Nutritional Risks: None ?Diabetes: No ? ?How often do you need to have someone help you when you read instructions, pamphlets, or other written materials from your doctor or pharmacy?: 1 - Never ? ?Diabetic? no ? ?Interpreter Needed?: No ? ?Information entered by :: Jonathan Brasington, LPN ? ? ?Activities of Daily Living ?In your present state of health, do you have any difficulty performing the following activities: 06/27/2021  ?Hearing? N  ?Vision? N  ?  Difficulty concentrating or making decisions? N  ?Walking or climbing stairs? N  ?Dressing or bathing? N  ?Doing errands, shopping? N  ?Preparing Food and eating ? N  ?Using the Toilet? N  ?In the past six months, have you accidently leaked urine? N  ?Do you have problems with loss of bowel control? N  ?Managing your Medications? N  ?Managing your Finances? N  ?Housekeeping or managing your Housekeeping? N  ?Some recent data might be hidden  ? ? ?Patient Care Team: ?Dettinger, Fransisca Kaufmann, MD as PCP - General (Family Medicine) ?Pieter Partridge, DO as Consulting Physician (Neurology) ?Derek Jack, MD as Medical Oncologist (Medical Oncology) ?Brien Mates, RN as Oncology Nurse Navigator (Medical Oncology) ? ?Indicate any recent Medical Services you may have received from other than [facility] providers in the past year (date may be approximate). ? ?   ?Assessment:  ? This is a routine wellness examination for Jonathan Burnett. ? ?Hearing/Vision screen ?Hearing Screening - Comments:: Denies hearing difficulties   ?Vision Screening - Comments::  Wears rx glasses - up to date with routine eye exams with [facility]  ? ?Dietary issues and exercise activities discussed: ?Current Exercise Habits: Home exercise routine, Type of exercise: walking, Time (Minute

## 2021-10-14 ENCOUNTER — Encounter: Payer: Medicare Other | Admitting: Family Medicine

## 2021-10-17 ENCOUNTER — Encounter: Payer: Self-pay | Admitting: Family Medicine

## 2021-10-19 DIAGNOSIS — M545 Low back pain, unspecified: Secondary | ICD-10-CM | POA: Diagnosis not present

## 2021-10-19 DIAGNOSIS — S51011A Laceration without foreign body of right elbow, initial encounter: Secondary | ICD-10-CM | POA: Diagnosis not present

## 2021-10-19 DIAGNOSIS — W182XXA Fall in (into) shower or empty bathtub, initial encounter: Secondary | ICD-10-CM | POA: Diagnosis not present

## 2021-10-21 DIAGNOSIS — M4126 Other idiopathic scoliosis, lumbar region: Secondary | ICD-10-CM | POA: Diagnosis not present

## 2021-10-21 DIAGNOSIS — Z9181 History of falling: Secondary | ICD-10-CM | POA: Diagnosis not present

## 2021-10-21 DIAGNOSIS — M545 Low back pain, unspecified: Secondary | ICD-10-CM | POA: Diagnosis not present

## 2021-10-21 DIAGNOSIS — M5136 Other intervertebral disc degeneration, lumbar region: Secondary | ICD-10-CM | POA: Diagnosis not present

## 2021-10-21 DIAGNOSIS — M4186 Other forms of scoliosis, lumbar region: Secondary | ICD-10-CM | POA: Diagnosis not present

## 2021-11-18 ENCOUNTER — Ambulatory Visit (INDEPENDENT_AMBULATORY_CARE_PROVIDER_SITE_OTHER): Payer: Medicare HMO | Admitting: Family Medicine

## 2021-11-18 ENCOUNTER — Encounter: Payer: Self-pay | Admitting: Family Medicine

## 2021-11-18 VITALS — BP 126/87 | HR 94 | Temp 97.9°F | Ht 72.0 in | Wt 141.0 lb

## 2021-11-18 DIAGNOSIS — K219 Gastro-esophageal reflux disease without esophagitis: Secondary | ICD-10-CM

## 2021-11-18 DIAGNOSIS — Z0001 Encounter for general adult medical examination with abnormal findings: Secondary | ICD-10-CM | POA: Diagnosis not present

## 2021-11-18 DIAGNOSIS — E782 Mixed hyperlipidemia: Secondary | ICD-10-CM

## 2021-11-18 DIAGNOSIS — J439 Emphysema, unspecified: Secondary | ICD-10-CM

## 2021-11-18 DIAGNOSIS — Z0000 Encounter for general adult medical examination without abnormal findings: Secondary | ICD-10-CM | POA: Diagnosis not present

## 2021-11-18 DIAGNOSIS — Z1159 Encounter for screening for other viral diseases: Secondary | ICD-10-CM

## 2021-11-18 DIAGNOSIS — Z125 Encounter for screening for malignant neoplasm of prostate: Secondary | ICD-10-CM

## 2021-11-18 DIAGNOSIS — M5136 Other intervertebral disc degeneration, lumbar region: Secondary | ICD-10-CM

## 2021-11-18 NOTE — Progress Notes (Signed)
BP 126/87   Pulse 94   Temp 97.9 F (36.6 C)   Ht 6' (1.829 m)   Wt 141 lb (64 kg)   SpO2 96%   BMI 19.12 kg/m    Subjective:   Patient ID: Jonathan Burnett, male    DOB: [birth year], 71 y.o.   MRN: [id number]  HPI: Jonathan Burnett is a 71 y.o. male presenting on 11/18/2021 for Medical Management of Chronic Issues (CPE)   HPI Physical exam Patient denies any chest pain, shortness of breath, headaches or vision issues, abdominal complaints, diarrhea, nausea, vomiting.  Patient does complain of right hip/lower back pain and sometimes weakness.  He says sometimes when he gets up especially will be very stiff and will give out on him and then he will fall, most recently he fell just a few days ago and gash to his forearm and got a bruise on his outside upper arm proximal to his elbow.  He says this is still been the case and he did try some physical therapy in the past but it still been bothering him significantly.  He just feels like he is off balance and his strength is not there especially on that right leg.  Hyperlipidemia Patient is coming in for recheck of his hyperlipidemia. The patient is currently taking no medication, diet control. They deny any issues with myalgias or history of liver damage from it. They deny any focal numbness or weakness or chest pain.   COPD Patient is coming in for COPD recheck today.  He is currently on no medicine.  He has a mild chronic cough but denies any major coughing spells or wheezing spells.  He has 0 nighttime symptoms per week and 0 daytime symptoms per week currently.  He denies any issues  GERD Patient is currently on no medicine.  he denies any major symptoms or abdominal pain or belching or burping. She denies any blood in her stool or lightheadedness or dizziness.   Relevant past medical, surgical, family and social history reviewed and updated as indicated. Interim medical history since our last visit reviewed. Allergies and medications reviewed  and updated.  Review of Systems  Constitutional:  Negative for chills and fever.  Respiratory:  Negative for shortness of breath and wheezing.   Cardiovascular:  Negative for chest pain and leg swelling.  Musculoskeletal:  Positive for arthralgias, back pain, gait problem and myalgias.  Skin:  Negative for rash.  All other systems reviewed and are negative.   Per HPI unless specifically indicated above   Allergies as of 11/18/2021       Reactions   Aspirin    GI upset/nausea        Medication List        Accurate as of November 18, 2021  3:11 PM. If you have any questions, ask your nurse or doctor.          prednisoLONE acetate 1 % ophthalmic suspension Commonly known as: PRED FORTE SMARTSIG:1 Drop(s) In Eye(s) Daily PRN         Objective:   BP 126/87   Pulse 94   Temp 97.9 F (36.6 C)   Ht 6' (1.829 m)   Wt 141 lb (64 kg)   SpO2 96%   BMI 19.12 kg/m   Wt Readings from Last 3 Encounters:  11/18/21 141 lb (64 kg)  06/27/21 145 lb (65.8 kg)  05/29/21 145 lb (65.8 kg)    Physical Exam Vitals and nursing note reviewed.  Constitutional:  General: He is not in acute distress.    Appearance: He is well-developed. He is not diaphoretic.  Eyes:     General: No scleral icterus.    Conjunctiva/sclera: Conjunctivae normal.  Neck:     Thyroid: No thyromegaly.  Cardiovascular:     Rate and Rhythm: Normal rate and regular rhythm.     Heart sounds: Normal heart sounds. No murmur heard. Pulmonary:     Effort: Pulmonary effort is normal. No respiratory distress.     Breath sounds: Normal breath sounds. No wheezing.  Musculoskeletal:        General: No tenderness or deformity. Normal range of motion.     Cervical back: Neck supple.     Right hip: Normal.     Left hip: Normal.     Right lower leg: No edema.     Left lower leg: No edema.  Lymphadenopathy:     Cervical: No cervical adenopathy.  Skin:    General: Skin is warm and dry.     Findings: No  rash.  Neurological:     Mental Status: He is alert and oriented to person, place, and time.     Coordination: Coordination normal.  Psychiatric:        Behavior: Behavior normal.       Assessment & Plan:   Problem List Items Addressed This Visit       Respiratory   COPD (chronic obstructive pulmonary disease) (HCC)     Digestive   GERD (gastroesophageal reflux disease)   Relevant Orders   CBC with Differential/Platelet     Other   HLD (hyperlipidemia)   Relevant Orders   CMP14+EGFR   Lipid panel   Other Visit Diagnoses     Physical exam    -  Primary   Relevant Orders   CBC with Differential/Platelet   CMP14+EGFR   Lipid panel   Need for hepatitis C screening test       Relevant Orders   Hepatitis C antibody   Degenerative disc disease, lumbar       Relevant Orders   Ambulatory referral to Orthopedic Surgery   Prostate cancer screening       Relevant Orders   PSA, total and free       We will refer to orthopedics for his hip and back for evaluation.  We will do blood work today. Follow up plan: Return in about 1 year (around 11/19/2022), or if symptoms worsen or fail to improve, for Physical exam.  Counseling provided for all of the vaccine components Orders Placed This Encounter  Procedures   CBC with Differential/Platelet   CMP14+EGFR   Lipid panel   PSA, total and free   Hepatitis C antibody   Ambulatory referral to [place] Elman Dettman, MD [facility] 11/18/2021, 3:11 PM

## 2021-11-19 ENCOUNTER — Other Ambulatory Visit: Payer: Self-pay

## 2021-11-19 DIAGNOSIS — M899 Disorder of bone, unspecified: Secondary | ICD-10-CM

## 2021-11-19 LAB — CBC WITH DIFFERENTIAL/PLATELET
Basophils Absolute: 0.1 10*3/uL (ref 0.0–0.2)
Basos: 1 %
EOS (ABSOLUTE): 0.3 10*3/uL (ref 0.0–0.4)
Eos: 3 %
Hematocrit: 48.6 % (ref 37.5–51.0)
Hemoglobin: 15.6 g/dL (ref 13.0–17.7)
Immature Grans (Abs): 0.1 10*3/uL (ref 0.0–0.1)
Immature Granulocytes: 1 %
Lymphocytes Absolute: 2.4 10*3/uL (ref 0.7–3.1)
Lymphs: 23 %
MCH: 30 pg (ref 26.6–33.0)
MCHC: 32.1 g/dL (ref 31.5–35.7)
MCV: 94 fL (ref 79–97)
Monocytes Absolute: 0.7 10*3/uL (ref 0.1–0.9)
Monocytes: 7 %
Neutrophils Absolute: 6.7 10*3/uL (ref 1.4–7.0)
Neutrophils: 65 %
Platelets: 259 10*3/uL (ref 150–450)
RBC: 5.2 x10E6/uL (ref 4.14–5.80)
RDW: 13.2 % (ref 11.6–15.4)
WBC: 10.4 10*3/uL (ref 3.4–10.8)

## 2021-11-19 LAB — CMP14+EGFR
ALT: 12 IU/L (ref 0–44)
AST: 19 IU/L (ref 0–40)
Albumin/Globulin Ratio: 1.7 (ref 1.2–2.2)
Albumin: 4.6 g/dL (ref 3.8–4.8)
Alkaline Phosphatase: 103 IU/L (ref 44–121)
BUN/Creatinine Ratio: 11 (ref 10–24)
BUN: 11 mg/dL (ref 8–27)
Bilirubin Total: 0.4 mg/dL (ref 0.0–1.2)
CO2: 21 mmol/L (ref 20–29)
Calcium: 9.9 mg/dL (ref 8.6–10.2)
Chloride: 100 mmol/L (ref 96–106)
Creatinine, Ser: 1.04 mg/dL (ref 0.76–1.27)
Globulin, Total: 2.7 g/dL (ref 1.5–4.5)
Glucose: 86 mg/dL (ref 70–99)
Potassium: 4.9 mmol/L (ref 3.5–5.2)
Sodium: 137 mmol/L (ref 134–144)
Total Protein: 7.3 g/dL (ref 6.0–8.5)
eGFR: 76 mL/min/{1.73_m2} (ref >59)

## 2021-11-19 LAB — PSA, TOTAL AND FREE
PSA, Free Pct: 25 %
PSA, Free: 0.55 ng/mL
Prostate Specific Ag, Serum: 2.2 ng/mL (ref 0.0–4.0)

## 2021-11-19 LAB — LIPID PANEL
Chol/HDL Ratio: 4.1 ratio (ref 0.0–5.0)
Cholesterol, Total: 154 mg/dL (ref 100–199)
HDL: 38 mg/dL — ABNORMAL LOW (ref >39)
LDL Chol Calc (NIH): 88 mg/dL (ref 0–99)
Triglycerides: 161 mg/dL — ABNORMAL HIGH (ref 0–149)
VLDL Cholesterol Cal: 28 mg/dL (ref 5–40)

## 2021-11-19 LAB — HEPATITIS C ANTIBODY: Hep C Virus Ab: NONREACTIVE

## 2021-11-20 ENCOUNTER — Inpatient Hospital Stay: Payer: Medicare HMO | Attending: Hematology

## 2021-11-20 DIAGNOSIS — M899 Disorder of bone, unspecified: Secondary | ICD-10-CM | POA: Insufficient documentation

## 2021-11-21 ENCOUNTER — Inpatient Hospital Stay: Payer: Medicare HMO

## 2021-11-21 DIAGNOSIS — M899 Disorder of bone, unspecified: Secondary | ICD-10-CM

## 2021-11-22 LAB — KAPPA/LAMBDA LIGHT CHAINS
Kappa free light chain: 24.2 mg/L — ABNORMAL HIGH (ref 3.3–19.4)
Kappa, lambda light chain ratio: 1.5 (ref 0.26–1.65)
Lambda free light chains: 16.1 mg/L (ref 5.7–26.3)

## 2021-11-25 LAB — PROTEIN ELECTROPHORESIS, SERUM
A/G Ratio: 1.4 (ref 0.7–1.7)
Albumin ELP: 3.9 g/dL (ref 2.9–4.4)
Alpha-1-Globulin: 0.2 g/dL (ref 0.0–0.4)
Alpha-2-Globulin: 0.7 g/dL (ref 0.4–1.0)
Beta Globulin: 0.8 g/dL (ref 0.7–1.3)
Gamma Globulin: 1 g/dL (ref 0.4–1.8)
Globulin, Total: 2.8 g/dL (ref 2.2–3.9)
Total Protein ELP: 6.7 g/dL (ref 6.0–8.5)

## 2021-11-26 LAB — IMMUNOFIXATION ELECTROPHORESIS
IgA: 158 mg/dL (ref 61–437)
IgG (Immunoglobin G), Serum: 1065 mg/dL (ref 603–1613)
IgM (Immunoglobulin M), Srm: 117 mg/dL (ref 15–143)
Total Protein ELP: 6.4 g/dL (ref 6.0–8.5)

## 2021-11-27 ENCOUNTER — Inpatient Hospital Stay: Payer: Medicare HMO | Admitting: Hematology

## 2022-01-08 DIAGNOSIS — M79641 Pain in right hand: Secondary | ICD-10-CM | POA: Diagnosis not present

## 2022-01-08 DIAGNOSIS — M545 Low back pain, unspecified: Secondary | ICD-10-CM | POA: Diagnosis not present

## 2022-01-26 DIAGNOSIS — M5451 Vertebrogenic low back pain: Secondary | ICD-10-CM | POA: Diagnosis not present

## 2022-02-10 DIAGNOSIS — M6281 Muscle weakness (generalized): Secondary | ICD-10-CM | POA: Diagnosis not present

## 2022-02-10 DIAGNOSIS — M545 Low back pain, unspecified: Secondary | ICD-10-CM | POA: Diagnosis not present

## 2022-04-24 ENCOUNTER — Ambulatory Visit: Payer: Medicare HMO | Admitting: Family Medicine

## 2022-04-29 ENCOUNTER — Encounter: Payer: Self-pay | Admitting: Family Medicine

## 2022-06-27 IMAGING — PT NM PET TUM IMG INITIAL (PI) WHOLE BODY
7 series · 25 of 25 positions shown · non-contrast
Comparison: MRI brain 05/02/2021

CLINICAL DATA: Initial treatment strategy for skull lesion,
possible multiple myeloma

EXAM:
NUCLEAR MEDICINE PET WHOLE BODY
TECHNIQUE: 7.7 mCi F-18 FDG was injected intravenously. Full-ring PET imaging
was performed from the head to foot after the radiotracer. CT data
was obtained and used for attenuation correction and anatomic
localization.
Fasting blood glucose: 99 mg/dl

[Series 3: ctac · axial · 3.0mm · 0.98mm/px · z∈[+18,+1876]mm · 4 of 620 slices shown]
[im 1/620  soft-tissue]
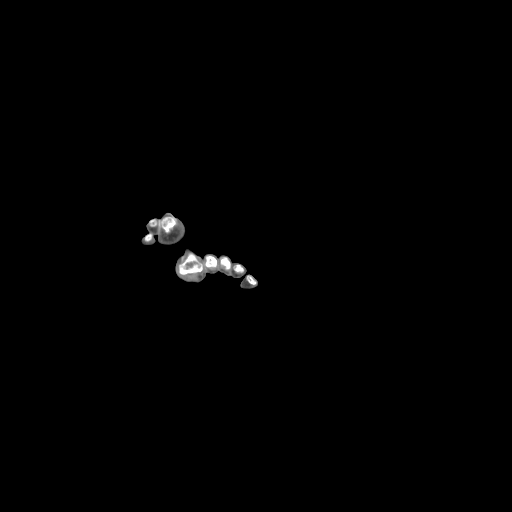
[im 207/620  soft-tissue]
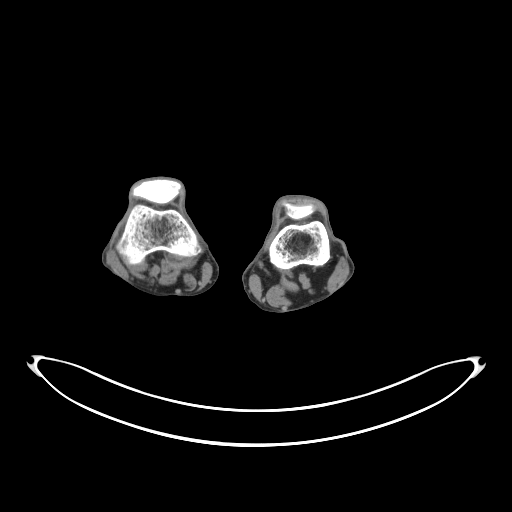
[im 413/620  soft-tissue]
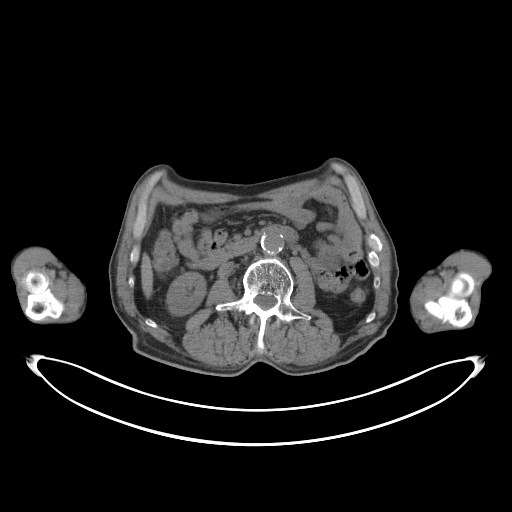
[im 620/620  full-range]
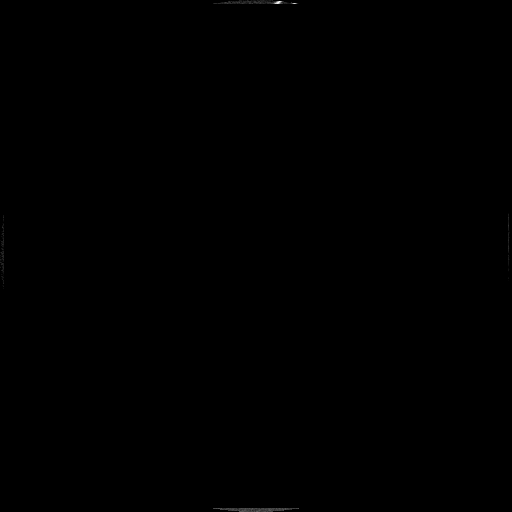

[Series 4: pet ac · axial · 3.0mm · 4.11mm/px · z∈[+18,+1876]mm · 5 of 620 slices shown]
[im 1/620]
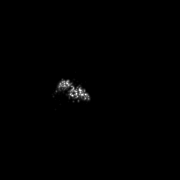
[im 155/620]
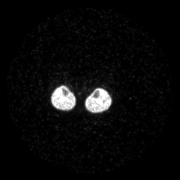
[im 310/620]
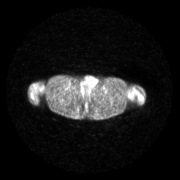
[im 465/620]
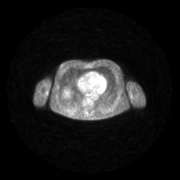
[im 620/620]
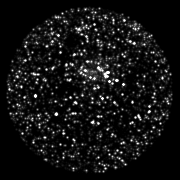

[Series 5: pet nac · axial · 3.0mm · 4.11mm/px · z∈[+18,+1876]mm · 5 of 620 slices shown]
[im 1/620]
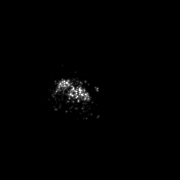
[im 155/620]
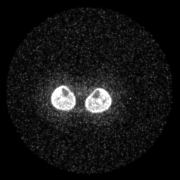
[im 310/620]
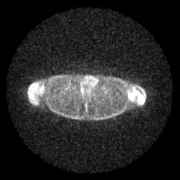
[im 465/620]
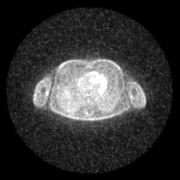
[im 620/620]
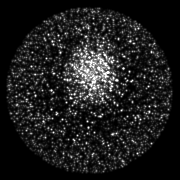

[Series 7: ct lung · axial · 3.0mm · 0.98mm/px · 1 of 103 slices shown]
[im 1/103  lung]
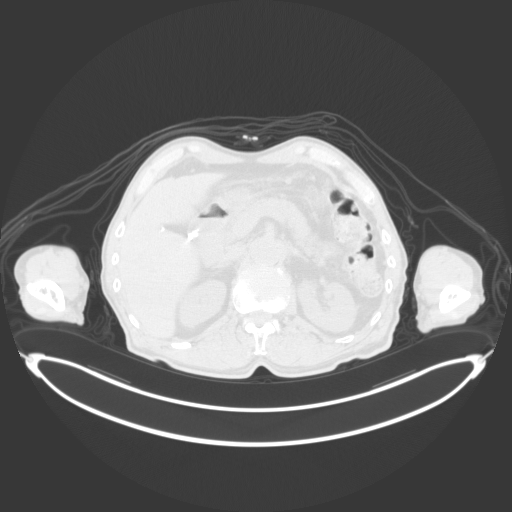

[Series 606: fused tra · 8 of 919 slices shown]
[im 1/919]
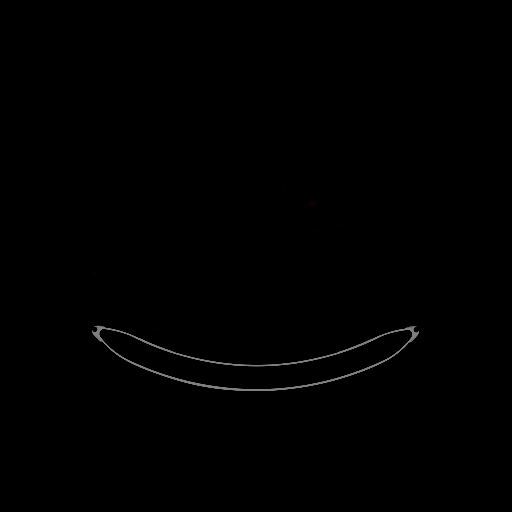
[im 132/919]
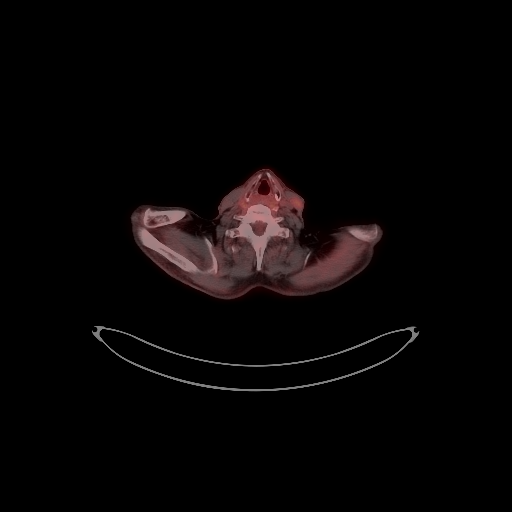
[im 263/919]
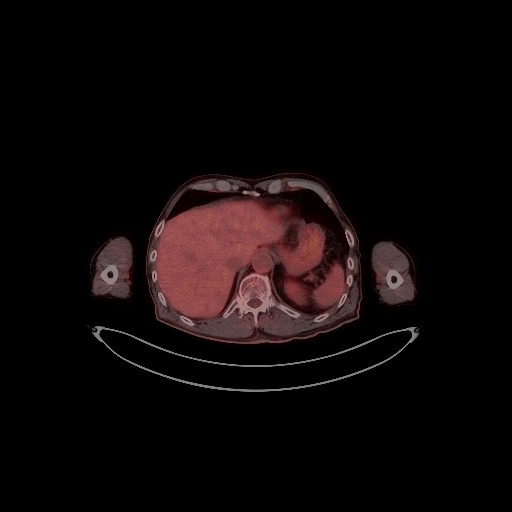
[im 394/919]
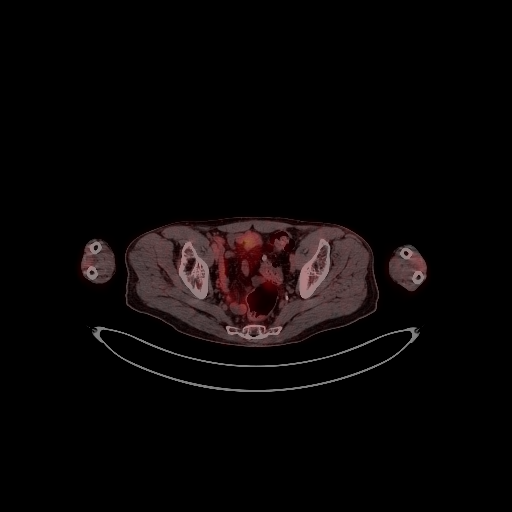
[im 525/919]
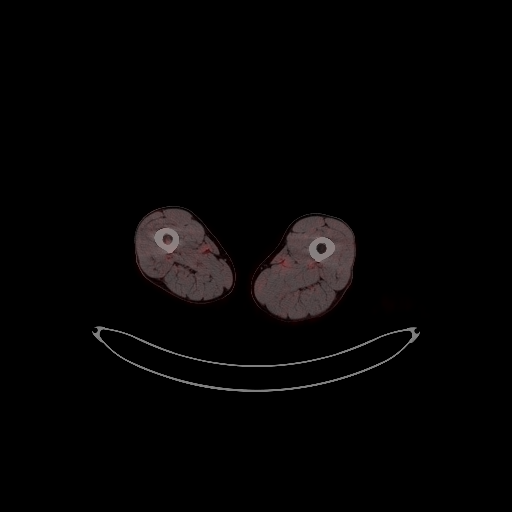
[im 656/919]
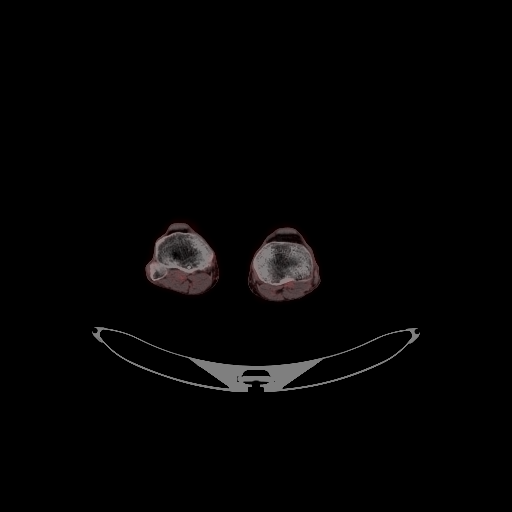
[im 787/919]
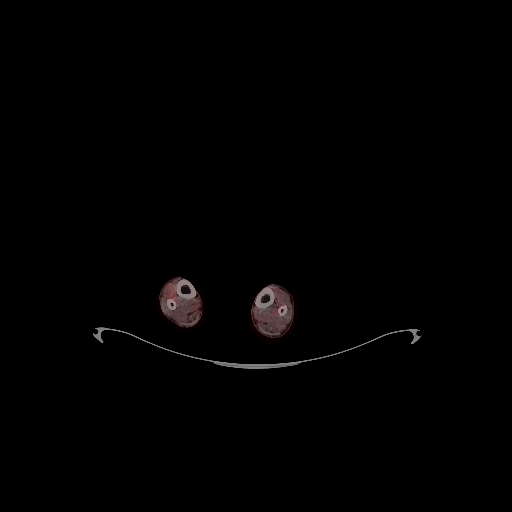
[im 919/919]
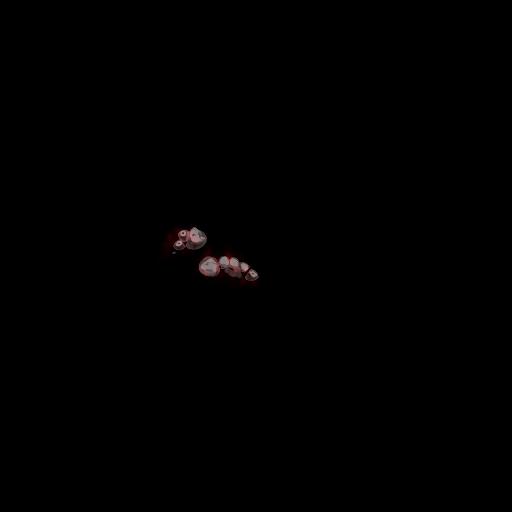

[Series 608: fused cor · 1 of 100 slices shown]
[im 1/100]
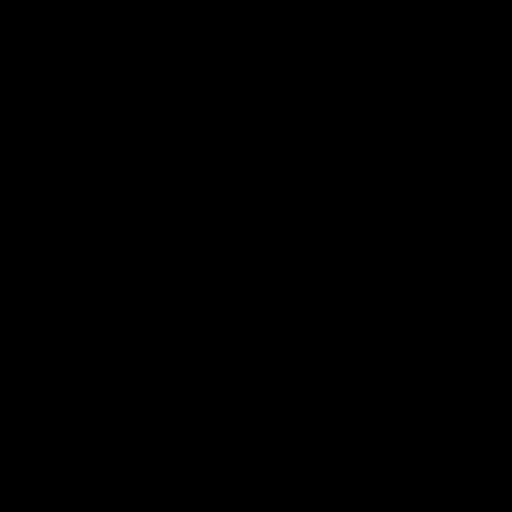

[Series 609: mip cine · coronal · 3.84mm/px · 1 of 48 slices shown]
[im 1/48]
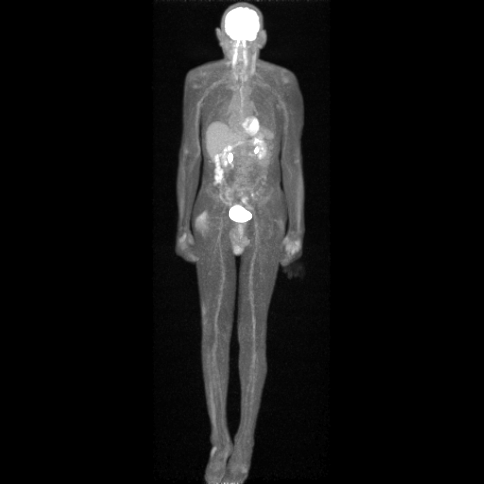

[25 of 25 positions shown; findings below may reference images not displayed]

FINDINGS: Mediastinal blood pool activity: SUV max

HEAD/NECK: Incidental physiologic activity in the longus [place]
muscles.

Incidental CT findings: Chronic bilateral maxillary and ethmoid
sinusitis. Bilateral common carotid atherosclerotic calcification.

CHEST: No significant abnormal hypermetabolic activity in this
region.

Incidental CT findings: Coronary, aortic arch, and branch vessel
atherosclerotic vascular disease. Centrilobular emphysema.

ABDOMEN/PELVIS: Physiologic activity in bowel.

Incidental CT findings: Atherosclerosis is present, including
aortoiliac atherosclerotic disease. Cholecystectomy. 3 mm right
kidney upper pole nonobstructive renal calculus. Sigmoid colon
diverticulosis. Prostatomegaly.

SKELETON: The calvarial lesion of concern on MRI is not
hypermetabolic, and poorly appreciable on the dedicated CT images.
There is no lytic lesion in this vicinity. No hypermetabolic
skeletal lesion is observed.

Incidental CT findings: none

EXTREMITIES: No significant abnormal hypermetabolic activity in this
region.

Incidental CT findings: none
IMPRESSION: 1. The calvarial lesion of concern on prior MRI is not
hypermetabolic and not readily visible on the CT images. No other
bony lesion is identified.
2. Other imaging findings of potential clinical significance: Aortic
Atherosclerosis (0HTH6-DI8.8). Coronary atherosclerosis. Emphysema
(0HTH6-TUG.W). Peripheral atherosclerosis. Chronic paranasal
sinusitis. Sigmoid colon diverticulosis. Prostatomegaly.
Nonobstructive right nephrolithiasis.

## 2022-07-01 ENCOUNTER — Ambulatory Visit: Payer: Medicare PPO

## 2022-07-01 VITALS — Ht 69.0 in | Wt 141.0 lb

## 2022-07-01 DIAGNOSIS — Z0000 Encounter for general adult medical examination without abnormal findings: Secondary | ICD-10-CM | POA: Diagnosis not present

## 2022-07-01 NOTE — Patient Instructions (Signed)
Jonathan Burnett , Thank you for taking time to come for your Medicare Wellness Visit. I appreciate your ongoing commitment to your health goals. Please review the following plan we discussed and let me know if I can assist you in the future.   These are the goals we discussed:  Goals      DIET - INCREASE WATER INTAKE     Exercise 150 min/wk Moderate Activity     Patient Stated     05/07/2020 AWV Goal: Exercise for General Health  Patient will verbalize understanding of the benefits of increased physical activity: Exercising regularly is important. It will improve your overall fitness, flexibility, and endurance. Regular exercise also will improve your overall health. It can help you control your weight, reduce stress, and improve your bone density. Over the next year, patient will increase physical activity as tolerated with a goal of at least 150 minutes of moderate physical activity per week.  You can tell that you are exercising at a moderate intensity if your heart starts beating faster and you start breathing faster but can still hold a conversation. Moderate-intensity exercise ideas include: Walking 1 mile (1.6 km) in about 15 minutes Biking Hiking Golfing Dancing Water aerobics Patient will verbalize understanding of everyday activities that increase physical activity by providing examples like the following: Yard work, such as: Sales promotion account executive Gardening Washing windows or floors Patient will be able to explain general safety guidelines for exercising:  Before you start a new exercise program, talk with your health care provider. Do not exercise so much that you hurt yourself, feel dizzy, or get very short of breath. Wear comfortable clothes and wear shoes with good support. Drink plenty of water while you exercise to prevent dehydration or heat stroke. Work out until your breathing and your  heartbeat get faster.         This is a list of the screening recommended for you and due dates:  Health Maintenance  Topic Date Due   Flu Shot  11/12/2021   COVID-19 Vaccine (3 - 2023-24 season) 12/13/2021   Zoster (Shingles) Vaccine (2 of 2) 03/02/2023*   Medicare Annual Wellness Visit  07/01/2023   Colon Cancer Screening  11/30/2023   DTaP/Tdap/Td vaccine (2 - Td or Tdap) 01/07/2028   Pneumonia Vaccine  Completed   Hepatitis C Screening: USPSTF Recommendation to screen - Ages 44-79 yo.  Completed   HPV Vaccine  Aged Out  *Topic was postponed. The date shown is not the original due date.    Advanced directives: Advance directive discussed with you today. I have provided a copy for you to complete at home and have notarized. Once this is complete please bring a copy in to our office so we can scan it into your chart.   Conditions/risks identified: Aim for 30 minutes of exercise or brisk walking, 6-8 glasses of water, and 5 servings of fruits and vegetables each day.   Next appointment: Follow up in one year for your annual wellness visit.   Preventive Care 73 Years and Older, Male  Preventive care refers to lifestyle choices and visits with your health care provider that can promote health and wellness. What does preventive care include? A yearly physical exam. This is also called an annual well check. Dental exams once or twice a year. Routine eye exams. Ask your health care provider how often you should have your eyes checked. Personal lifestyle  choices, including: Daily care of your teeth and gums. Regular physical activity. Eating a healthy diet. Avoiding tobacco and drug use. Limiting alcohol use. Practicing safe sex. Taking low doses of aspirin every day. Taking vitamin and mineral supplements as recommended by your health care provider. What happens during an annual well check? The services and screenings done by your health care provider during your annual well  check will depend on your age, overall health, lifestyle risk factors, and family history of disease. Counseling  Your health care provider may ask you questions about your: Alcohol use. Tobacco use. Drug use. Emotional well-being. Home and relationship well-being. Sexual activity. Eating habits. History of falls. Memory and ability to understand (cognition). Work and work Statistician. Screening  You may have the following tests or measurements: Height, weight, and BMI. Blood pressure. Lipid and cholesterol levels. These may be checked every 5 years, or more frequently if you are over 72 years old. Skin check. Lung cancer screening. You may have this screening every year starting at age 35 if you have a 30-pack-year history of smoking and currently smoke or have quit within the past 15 years. Fecal occult blood test (FOBT) of the stool. You may have this test every year starting at age 9. Flexible sigmoidoscopy or colonoscopy. You may have a sigmoidoscopy every 5 years or a colonoscopy every 10 years starting at age 53. Prostate cancer screening. Recommendations will vary depending on your family history and other risks. Hepatitis C blood test. Hepatitis B blood test. Sexually transmitted disease (STD) testing. Diabetes screening. This is done by checking your blood sugar (glucose) after you have not eaten for a while (fasting). You may have this done every 1-3 years. Abdominal aortic aneurysm (AAA) screening. You may need this if you are a current or former smoker. Osteoporosis. You may be screened starting at age 73 if you are at high risk. Talk with your health care provider about your test results, treatment options, and if necessary, the need for more tests. Vaccines  Your health care provider may recommend certain vaccines, such as: Influenza vaccine. This is recommended every year. Tetanus, diphtheria, and acellular pertussis (Tdap, Td) vaccine. You may need a Td booster  every 10 years. Zoster vaccine. You may need this after age 20. Pneumococcal 13-valent conjugate (PCV13) vaccine. One dose is recommended after age 25. Pneumococcal polysaccharide (PPSV23) vaccine. One dose is recommended after age 66. Talk to your health care provider about which screenings and vaccines you need and how often you need them. This information is not intended to replace advice given to you by your health care provider. Make sure you discuss any questions you have with your health care provider. Document Released: 04/27/2015 Document Revised: 12/19/2015 Document Reviewed: 01/30/2015 Elsevier Interactive Patient Education  2017 [place] Prevention in the Home Falls can cause injuries. They can happen to people of all ages. There are many things you can do to make your home safe and to help prevent falls. What can I do on the outside of my home? Regularly fix the edges of walkways and driveways and fix any cracks. Remove anything that might make you trip as you walk through a door, such as a raised step or threshold. Trim any bushes or trees on the path to your home. Use bright outdoor lighting. Clear any walking paths of anything that might make someone trip, such as rocks or tools. Regularly check to see if handrails are loose or broken. Make sure that  both sides of any steps have handrails. Any raised decks and porches should have guardrails on the edges. Have any leaves, snow, or ice cleared regularly. Use sand or salt on walking paths during winter. Clean up any spills in your garage right away. This includes oil or grease spills. What can I do in the bathroom? Use night lights. Install grab bars by the toilet and in the tub and shower. Do not use towel bars as grab bars. Use non-skid mats or decals in the tub or shower. If you need to sit down in the shower, use a plastic, non-slip stool. Keep the floor dry. Clean up any water that spills on the floor as soon  as it happens. Remove soap buildup in the tub or shower regularly. Attach bath mats securely with double-sided non-slip rug tape. Do not have throw rugs and other things on the floor that can make you trip. What can I do in the bedroom? Use night lights. Make sure that you have a light by your bed that is easy to reach. Do not use any sheets or blankets that are too big for your bed. They should not hang down onto the floor. Have a firm chair that has side arms. You can use this for support while you get dressed. Do not have throw rugs and other things on the floor that can make you trip. What can I do in the kitchen? Clean up any spills right away. Avoid walking on wet floors. Keep items that you use a lot in easy-to-reach places. If you need to reach something above you, use a strong step stool that has a grab bar. Keep electrical cords out of the way. Do not use floor polish or wax that makes floors slippery. If you must use wax, use non-skid floor wax. Do not have throw rugs and other things on the floor that can make you trip. What can I do with my stairs? Do not leave any items on the stairs. Make sure that there are handrails on both sides of the stairs and use them. Fix handrails that are broken or loose. Make sure that handrails are as long as the stairways. Check any carpeting to make sure that it is firmly attached to the stairs. Fix any carpet that is loose or worn. Avoid having throw rugs at the top or bottom of the stairs. If you do have throw rugs, attach them to the floor with carpet tape. Make sure that you have a light switch at the top of the stairs and the bottom of the stairs. If you do not have them, ask someone to add them for you. What else can I do to help prevent falls? Wear shoes that: Do not have high heels. Have rubber bottoms. Are comfortable and fit you well. Are closed at the toe. Do not wear sandals. If you use a stepladder: Make sure that it is fully  opened. Do not climb a closed stepladder. Make sure that both sides of the stepladder are locked into place. Ask someone to hold it for you, if possible. Clearly mark and make sure that you can see: Any grab bars or handrails. First and last steps. Where the edge of each step is. Use tools that help you move around (mobility aids) if they are needed. These include: Canes. Walkers. Scooters. Crutches. Turn on the lights when you go into a dark area. Replace any light bulbs as soon as they burn out. Set up your furniture so  you have a clear path. Avoid moving your furniture around. If any of your floors are uneven, fix them. If there are any pets around you, be aware of where they are. Review your medicines with your doctor. Some medicines can make you feel dizzy. This can increase your chance of falling. Ask your doctor what other things that you can do to help prevent falls. This information is not intended to replace advice given to you by your health care provider. Make sure you discuss any questions you have with your health care provider. Document Released: 01/25/2009 Document Revised: 09/06/2015 Document Reviewed: 05/05/2014 Elsevier Interactive Patient Education  2017 [employer].

## 2022-07-01 NOTE — Progress Notes (Signed)
Subjective:   Jonathan Burnett is a 72 y.o. male who presents for Medicare Annual/Subsequent preventive examination. I connected with  Jonathan Burnett on 07/01/22 by a audio enabled telemedicine application and verified that I am speaking with the correct person using two identifiers.  Patient Location: Home  Provider Location: Home Office  I discussed the limitations of evaluation and management by telemedicine. The patient expressed understanding and agreed to proceed.  Review of Systems     Cardiac Risk Factors include: advanced age (>35men, >4 women);male gender     Objective:    Today's Vitals   07/01/22 1500  Weight: 141 lb (64 kg)  Height: 5\' 9"  (1.753 m)   Body mass index is 20.82 kg/m.     07/01/2022    3:03 PM 06/27/2021   11:39 AM 05/29/2021   12:54 PM 05/14/2021    8:27 AM 07/05/2020    3:33 PM 05/07/2020    1:46 PM 10/24/2019    9:06 AM  Advanced Directives  Does Patient Have a Medical Advance Directive? No No No No No Yes No  Type of Teacher, early years/pre;Living will   Copy of [place] in Chart?      Yes - validated most recent copy scanned in chart (See row information)   Would patient like information on creating a medical advance directive? No - Patient declined No - Patient declined No - Patient declined No - Patient declined   No - Patient declined    Current Medications (verified) Outpatient Encounter Medications as of 07/01/2022  Medication Sig   prednisoLONE acetate (PRED FORTE) 1 % ophthalmic suspension SMARTSIG:1 Drop(s) In Eye(s) Daily PRN   No facility-administered encounter medications on file as of 07/01/2022.    Allergies (verified) Aspirin   History: Past Medical History:  Diagnosis Date   Anemia    Blood transfusion without reported diagnosis    with appendix rupture    Colon polyp    Diverticulosis    ED (erectile dysfunction)    GERD (gastroesophageal reflux disease)    diet controlled    Hyperlipidemia    Hypoglycemia    Wears dentures full   Wears glasses    Past Surgical History:  Procedure Laterality Date   APPENDECTOMY     CHOLECYSTECTOMY     COLONOSCOPY     COLONOSCOPY WITH PROPOFOL N/A 02/28/2019   Procedure: COLONOSCOPY WITH PROPOFOL;  Surgeon: Irving Copas., MD;  Location: [place];  Service: Gastroenterology;  Laterality: N/A;   COLONOSCOPY WITH PROPOFOL N/A 10/24/2019   Procedure: COLONOSCOPY WITH PROPOFOL;  Surgeon: Rush Landmark Telford Nab., MD;  Location: [place];  Service: Gastroenterology;  Laterality: N/A;   ENDOSCOPIC MUCOSAL RESECTION N/A 02/28/2019   Procedure: ENDOSCOPIC MUCOSAL RESECTION;  Surgeon: Rush Landmark Telford Nab., MD;  Location: [place];  Service: Gastroenterology;  Laterality: N/A;   HEMOSTASIS CLIP PLACEMENT  02/28/2019   Procedure: HEMOSTASIS CLIP PLACEMENT;  Surgeon: Irving Copas., MD;  Location: [place];  Service: Gastroenterology;;   POLYPECTOMY     POLYPECTOMY  10/24/2019   Procedure: POLYPECTOMY;  Surgeon: Irving Copas., MD;  Location: [place];  Service: Gastroenterology;;   SUBMUCOSAL LIFTING INJECTION  02/28/2019   Procedure: SUBMUCOSAL LIFTING INJECTION;  Surgeon: Irving Copas., MD;  Location[facility] ENDOSCOPY;  Service: Gastroenterology;;   Family History  Problem Relation Age of Onset   Hypertension Father    Lung cancer Father    Stomach cancer Brother  Healthy Sister    Healthy Daughter    Healthy Brother    Healthy Brother    Healthy Brother    Healthy Sister    Healthy Daughter    Colon polyps Neg Hx    Colon cancer Neg Hx    Esophageal cancer Neg Hx    Rectal cancer Neg Hx    Inflammatory bowel disease Neg Hx    Liver disease Neg Hx    Pancreatic cancer Neg Hx    Social History   Socioeconomic History   Marital status: Married    Spouse name: Inez Catalina   Number of children: 4   Years of education: 10   Highest education level: 9th grade   Occupational History   Occupation: English as a second language teacher: [employer]    Comment: Retired  Tobacco Use   Smoking status: Never   Smokeless tobacco: Never  Vaping Use   Vaping Use: Never used  Substance and Sexual Activity   Alcohol use: No   Drug use: No   Sexual activity: Yes  Other Topics Concern   Not on file  Social History Narrative   Patient is right-handed. He lives with his wife in one level home      Enjoys being outdoors. Has one pet dog.    Social Determinants of Health   Financial Resource Strain: Low Risk  (07/01/2022)   Overall Financial Resource Strain (CARDIA)    Difficulty of Paying Living Expenses: Not hard at all  Food Insecurity: No Food Insecurity (07/01/2022)   Hunger Vital Sign    Worried About Running Out of Food in the Last Year: Never true    Ran Out of Food in the Last Year: Never true  Transportation Needs: No Transportation Needs (06/27/2021)   PRAPARE - Hydrologist (Medical): No    Lack of Transportation (Non-Medical): No  Physical Activity: Inactive (07/01/2022)   Exercise Vital Sign    Days of Exercise per Week: 0 days    Minutes of Exercise per Session: 0 min  Stress: No Stress Concern Present (07/01/2022)   [place]    Feeling of Stress : Not at all  Social Connections: Moderately Isolated (07/01/2022)   Social Connection and Isolation Panel [NHANES]    Frequency of Communication with Friends and Family: More than three times a week    Frequency of Social Gatherings with Friends and Family: More than three times a week    Attends Religious Services: Never    Marine scientist or Organizations: No    Attends Music therapist: Never    Marital Status: Married    Tobacco Counseling Counseling given: Not Answered   Clinical Intake:  Pre-visit preparation completed: Yes  Pain : No/denies pain     Nutritional  Risks: None Diabetes: No  How often do you need to have someone help you when you read instructions, pamphlets, or other written materials from your doctor or pharmacy?: 1 - Never  Diabetic?no   Interpreter Needed?: No  Information entered by :: Jadene Pierini, LPN   Activities of Daily Living    07/01/2022    3:03 PM  In your present state of health, do you have any difficulty performing the following activities:  Hearing? 0  Vision? 0  Difficulty concentrating or making decisions? 0  Walking or climbing stairs? 0  Dressing or bathing? 0  Doing errands, shopping? 0  Preparing Food  and eating ? N  Using the Toilet? N  In the past six months, have you accidently leaked urine? N  Do you have problems with loss of bowel control? N  Managing your Medications? N  Managing your Finances? N  Housekeeping or managing your Housekeeping? N    Patient Care Team: Dettinger, Fransisca Kaufmann, MD as PCP - General (Family Medicine) Pieter Partridge, DO as Consulting Physician (Neurology) Derek Jack, MD as Medical Oncologist (Medical Oncology) Brien Mates, RN as Oncology Nurse Navigator (Medical Oncology)  Indicate any recent Medical Services you may have received from other than [facility] providers in the past year (date may be approximate).     Assessment:   This is a routine wellness examination for Jonathan Burnett.  Hearing/Vision screen Vision Screening - Comments:: Wears rx glasses - up to date with routine eye exams with  Dr.Gickengak  Dietary issues and exercise activities discussed: Current Exercise Habits: The patient does not participate in regular exercise at present, Exercise limited by: orthopedic condition(s)   Goals Addressed             This Visit's Progress    DIET - INCREASE WATER INTAKE   On track      Depression Screen    07/01/2022    3:02 PM 06/27/2021   11:38 AM 10/12/2020    8:44 AM 06/26/2020    9:17 AM 05/07/2020    1:47 PM 04/19/2020    3:03 PM 10/13/2019    10:49 AM  PHQ 2/9 Scores  PHQ - 2 Score 0 0 0 0 0 0 0    Fall Risk    07/01/2022    3:01 PM 06/27/2021   11:36 AM 05/01/2021    2:37 PM 10/12/2020    8:44 AM 06/26/2020    9:17 AM  Fall Risk   Falls in the past year? 1 1 1  0 1  Number falls in past yr: 1 1 1  1   Injury with Fall? 1 1 1   0  Risk for fall due to : History of fall(s);Impaired balance/gait;Orthopedic patient History of fall(s);Impaired balance/gait;Orthopedic patient Impaired balance/gait;History of fall(s)  History of fall(s);Impaired balance/gait  Follow up Education provided;Falls prevention discussed Education provided;Falls prevention discussed Falls prevention discussed  Falls evaluation completed    FALL RISK PREVENTION PERTAINING TO THE HOME:  Any stairs in or around the home? Yes  If so, are there any without handrails? No  Home free of loose throw rugs in walkways, pet beds, electrical cords, etc? No  Adequate lighting in your home to reduce risk of falls? No   ASSISTIVE DEVICES UTILIZED TO PREVENT FALLS:  Life alert? No  Use of a cane, walker or w/c? Yes  Grab bars in the bathroom? Yes  Shower chair or bench in shower? No  Elevated toilet seat or a handicapped toilet? No       07/28/2017    1:41 PM  MMSE - Mini Mental State Exam  Not completed: Unable to complete        07/01/2022    3:03 PM 06/27/2021   11:41 AM 05/07/2020    1:50 PM 11/29/2018    2:48 PM  6CIT Screen  What Year? 0 points 0 points 4 points 0 points  What month? 0 points 0 points 0 points 0 points  What time? 0 points 0 points 0 points 0 points  Count back from 20 0 points 0 points 2 points 0 points  Months in reverse 2 points 2  points 4 points 0 points  Repeat phrase 0 points 6 points 4 points 0 points  Total Score 2 points 8 points 14 points 0 points    Immunizations Immunization History  Administered Date(s) Administered   Fluad Quad(high Dose 65+) 12/22/2018, 01/09/2021   Influenza, High Dose Seasonal PF 01/06/2017,  01/06/2018   Influenza-Unspecified 02/24/2020   Moderna Sars-Covid-2 Vaccination 09/06/2019, 10/03/2019   Pneumococcal Conjugate-13 10/21/2018   Pneumococcal Polysaccharide-23 05/21/2017   Tdap 01/06/2018   Zoster Recombinat (Shingrix) 10/12/2020    TDAP status: Up to date  Flu Vaccine status: Due, Education has been provided regarding the importance of this vaccine. Advised may receive this vaccine at local pharmacy or Health Dept. Aware to provide a copy of the vaccination record if obtained from local pharmacy or Health Dept. Verbalized acceptance and understanding.  Pneumococcal vaccine status: Up to date  Covid-19 vaccine status: Completed vaccines  Qualifies for Shingles Vaccine? Yes   Zostavax completed Yes   Shingrix Completed?: Yes  Screening Tests Health Maintenance  Topic Date Due   INFLUENZA VACCINE  11/12/2021   COVID-19 Vaccine (3 - 2023-24 season) 12/13/2021   Zoster Vaccines- Shingrix (2 of 2) 03/02/2023 (Originally 12/07/2020)   Medicare Annual Wellness (AWV)  07/01/2023   COLONOSCOPY (Pts 45-82yrs Insurance coverage will need to be confirmed)  11/30/2023   DTaP/Tdap/Td (2 - Td or Tdap) 01/07/2028   Pneumonia Vaccine 82+ Years old  Completed   Hepatitis C Screening  Completed   HPV VACCINES  Aged Out    Health Maintenance  Health Maintenance Due  Topic Date Due   INFLUENZA VACCINE  11/12/2021   COVID-19 Vaccine (3 - 2023-24 season) 12/13/2021    Colorectal cancer screening: Type of screening: Colonoscopy. Completed 11/29/2020. Repeat every 3 years  Lung Cancer Screening: (Low Dose CT Chest recommended if Age 61-80 years, 30 pack-year currently smoking OR have quit w/in 15years.) does qualify.   Lung Cancer Screening Referral: declines   Additional Screening:  Hepatitis C Screening: does not qualify; Completed 11/18/2021  Vision Screening: Recommended annual ophthalmology exams for early detection of glaucoma and other disorders of the eye. Is the  patient up to date with their annual eye exam?  Yes  Who is the provider or what is the name of the office in which the patient attends annual eye exams? Dr.Gingingak If pt is not established with a provider, would they like to be referred to a provider to establish care? No .   Dental Screening: Recommended annual dental exams for proper oral hygiene  Community Resource Referral / Chronic Care Management: CRR required this visit?  No   CCM required this visit?  No      Plan:     I have personally reviewed and noted the following in the patient's chart:   Medical and social history Use of alcohol, tobacco or illicit drugs  Current medications and supplements including opioid prescriptions. Patient is not currently taking opioid prescriptions. Functional ability and status Nutritional status Physical activity Advanced directives List of other physicians Hospitalizations, surgeries, and ER visits in previous 12 months Vitals Screenings to include cognitive, depression, and falls Referrals and appointments  In addition, I have reviewed and discussed with patient certain preventive protocols, quality metrics, and best practice recommendations. A written personalized care plan for preventive services as well as general preventive health recommendations were provided to patient.     Daphane Shepherd, LPN   [id number]   Nurse Notes: none

## 2022-08-05 DIAGNOSIS — R4189 Other symptoms and signs involving cognitive functions and awareness: Secondary | ICD-10-CM | POA: Diagnosis not present

## 2022-08-05 DIAGNOSIS — G8111 Spastic hemiplegia affecting right dominant side: Secondary | ICD-10-CM | POA: Diagnosis not present

## 2022-08-05 DIAGNOSIS — R471 Dysarthria and anarthria: Secondary | ICD-10-CM | POA: Diagnosis not present

## 2022-08-05 DIAGNOSIS — R296 Repeated falls: Secondary | ICD-10-CM | POA: Diagnosis not present

## 2022-08-22 DIAGNOSIS — R4189 Other symptoms and signs involving cognitive functions and awareness: Secondary | ICD-10-CM | POA: Diagnosis not present

## 2022-08-22 DIAGNOSIS — M4802 Spinal stenosis, cervical region: Secondary | ICD-10-CM | POA: Diagnosis not present

## 2022-08-22 DIAGNOSIS — R471 Dysarthria and anarthria: Secondary | ICD-10-CM | POA: Diagnosis not present

## 2022-08-22 DIAGNOSIS — G8111 Spastic hemiplegia affecting right dominant side: Secondary | ICD-10-CM | POA: Diagnosis not present

## 2022-08-22 DIAGNOSIS — G9389 Other specified disorders of brain: Secondary | ICD-10-CM | POA: Diagnosis not present

## 2022-09-17 DIAGNOSIS — R471 Dysarthria and anarthria: Secondary | ICD-10-CM | POA: Diagnosis not present

## 2022-09-17 DIAGNOSIS — G8111 Spastic hemiplegia affecting right dominant side: Secondary | ICD-10-CM | POA: Diagnosis not present

## 2022-10-17 DIAGNOSIS — R9439 Abnormal result of other cardiovascular function study: Secondary | ICD-10-CM | POA: Diagnosis not present

## 2022-10-17 DIAGNOSIS — I6523 Occlusion and stenosis of bilateral carotid arteries: Secondary | ICD-10-CM | POA: Diagnosis not present

## 2022-10-17 DIAGNOSIS — G8111 Spastic hemiplegia affecting right dominant side: Secondary | ICD-10-CM | POA: Diagnosis not present

## 2022-11-20 ENCOUNTER — Encounter: Payer: Medicare HMO | Admitting: Family Medicine

## 2022-12-23 DIAGNOSIS — G8111 Spastic hemiplegia affecting right dominant side: Secondary | ICD-10-CM | POA: Diagnosis not present

## 2023-01-17 ENCOUNTER — Encounter (HOSPITAL_COMMUNITY): Payer: Self-pay | Admitting: *Deleted

## 2023-01-17 ENCOUNTER — Emergency Department (HOSPITAL_COMMUNITY): Payer: Medicare HMO

## 2023-01-17 ENCOUNTER — Other Ambulatory Visit: Payer: Self-pay

## 2023-01-17 ENCOUNTER — Emergency Department (HOSPITAL_COMMUNITY)
Admission: EM | Admit: 2023-01-17 | Discharge: 2023-01-17 | Disposition: A | Discharge status: Home / Self Care | Payer: Medicare HMO | Attending: Emergency Medicine | Admitting: Emergency Medicine

## 2023-01-17 DIAGNOSIS — M6281 Muscle weakness (generalized): Secondary | ICD-10-CM | POA: Diagnosis not present

## 2023-01-17 DIAGNOSIS — R531 Weakness: Secondary | ICD-10-CM | POA: Insufficient documentation

## 2023-01-17 DIAGNOSIS — M79641 Pain in right hand: Secondary | ICD-10-CM | POA: Diagnosis not present

## 2023-01-17 DIAGNOSIS — M19031 Primary osteoarthritis, right wrist: Secondary | ICD-10-CM | POA: Diagnosis not present

## 2023-01-17 DIAGNOSIS — M4807 Spinal stenosis, lumbosacral region: Secondary | ICD-10-CM | POA: Diagnosis not present

## 2023-01-17 DIAGNOSIS — M1811 Unilateral primary osteoarthritis of first carpometacarpal joint, right hand: Secondary | ICD-10-CM | POA: Diagnosis not present

## 2023-01-17 DIAGNOSIS — M48061 Spinal stenosis, lumbar region without neurogenic claudication: Secondary | ICD-10-CM | POA: Diagnosis not present

## 2023-01-17 DIAGNOSIS — M5126 Other intervertebral disc displacement, lumbar region: Secondary | ICD-10-CM | POA: Diagnosis not present

## 2023-01-17 DIAGNOSIS — M47814 Spondylosis without myelopathy or radiculopathy, thoracic region: Secondary | ICD-10-CM | POA: Diagnosis not present

## 2023-01-17 LAB — CBC WITH DIFFERENTIAL/PLATELET
Abs Immature Granulocytes: 0.06 10*3/uL (ref 0.00–0.07)
Basophils Absolute: 0.1 10*3/uL (ref 0.0–0.1)
Basophils Relative: 1 %
Eosinophils Absolute: 0.2 10*3/uL (ref 0.0–0.5)
Eosinophils Relative: 2 %
HCT: 42.5 % (ref 39.0–52.0)
Hemoglobin: 14.4 g/dL (ref 13.0–17.0)
Immature Granulocytes: 1 %
Lymphocytes Relative: 20 %
Lymphs Abs: 2 10*3/uL (ref 0.7–4.0)
MCH: 31.4 pg (ref 26.0–34.0)
MCHC: 33.9 g/dL (ref 30.0–36.0)
MCV: 92.8 fL (ref 80.0–100.0)
Monocytes Absolute: 0.7 10*3/uL (ref 0.1–1.0)
Monocytes Relative: 7 %
Neutro Abs: 7 10*3/uL (ref 1.7–7.7)
Neutrophils Relative %: 69 %
Platelets: 234 10*3/uL (ref 150–400)
RBC: 4.58 MIL/uL (ref 4.22–5.81)
RDW: 14 % (ref 11.5–15.5)
WBC: 10.1 10*3/uL (ref 4.0–10.5)
nRBC: 0 % (ref 0.0–0.2)

## 2023-01-17 LAB — COMPREHENSIVE METABOLIC PANEL
ALT: 15 U/L (ref 0–44)
AST: 19 U/L (ref 15–41)
Albumin: 3.4 g/dL — ABNORMAL LOW (ref 3.5–5.0)
Alkaline Phosphatase: 76 U/L (ref 38–126)
Anion gap: 6 (ref 5–15)
BUN: 15 mg/dL (ref 8–23)
CO2: 23 mmol/L (ref 22–32)
Calcium: 8.8 mg/dL — ABNORMAL LOW (ref 8.9–10.3)
Chloride: 108 mmol/L (ref 98–111)
Creatinine, Ser: 0.94 mg/dL (ref 0.61–1.24)
GFR, Estimated: >60 mL/min (ref >60)
Glucose, Bld: 85 mg/dL (ref 70–99)
Potassium: 3.9 mmol/L (ref 3.5–5.1)
Sodium: 137 mmol/L (ref 135–145)
Total Bilirubin: 0.7 mg/dL (ref 0.3–1.2)
Total Protein: 6.6 g/dL (ref 6.5–8.1)

## 2023-01-17 LAB — URINALYSIS, ROUTINE W REFLEX MICROSCOPIC
Bilirubin Urine: NEGATIVE
Glucose, UA: NEGATIVE mg/dL
Hgb urine dipstick: NEGATIVE
Ketones, ur: NEGATIVE mg/dL
Leukocytes,Ua: NEGATIVE
Nitrite: NEGATIVE
Protein, ur: NEGATIVE mg/dL
Specific Gravity, Urine: 1.008 (ref 1.005–1.030)
pH: 6 (ref 5.0–8.0)

## 2023-01-17 MED ORDER — HYDROCODONE-ACETAMINOPHEN 5-325 MG PO TABS: 1.0000 | ORAL_TABLET | Freq: Four times a day (QID) | ORAL | 0 refills | Status: DC | PRN

## 2023-01-17 MED ORDER — HYDROCODONE-ACETAMINOPHEN 5-325 MG PO TABS
1.0000 | ORAL_TABLET | Freq: Once | ORAL | Status: AC
  Administered 2023-01-17: 1 via ORAL
  Filled 2023-01-17: qty 1

## 2023-01-17 MED ORDER — HYDROCODONE-ACETAMINOPHEN 5-325 MG PO TABS
1.0000 | ORAL_TABLET | Freq: Once | ORAL | Status: DC
  Filled 2023-01-17: qty 1

## 2023-01-17 MED ORDER — HYDROCODONE-ACETAMINOPHEN 5-325 MG PO TABS
1.0000 | ORAL_TABLET | Freq: Four times a day (QID) | ORAL | 0 refills | Status: DC | PRN
Start: 2023-01-17 — End: 2023-01-17

## 2023-01-17 NOTE — Discharge Instructions (Signed)
Your lab work today does not show any signs of a new or acute problem.  The MRI of your mid spine and lower spine does not show any acute surgical emergencies.  You do have arthritis in your spine and some bulging disks, however this is not likely causing all of the problems that you have been experiencing.  Please take the medication for pain under close supervision.  Follow-up with your doctor in 2 days for recheck of your symptoms.

## 2023-01-17 NOTE — ED Provider Notes (Signed)
[place] EMERGENCY DEPARTMENT AT [facility] Provider Note   CSN: [id number] Arrival date & time: 01/17/23  1302     History {Add pertinent medical, surgical, social history, OB history to HPI:1} Chief Complaint  Patient presents with   Weakness   Extremity Weakness    Jonathan Burnett is a 72 y.o. male.  Patient with h/o right spastic hemiplegia, dysarthria, cognitive changes, followed by neurology in the [facility].  This has been a problem that is ongoing for at least a year.  Seems to be progressing somewhat.  History obtained from patient and wife at bedside.  It is really difficult to discern with their acute concern is today.  It does sound like he continues to refuse to use assistive devices at home which frustrates his wife.  He had a fall 2 days ago and sustained a skin tear to his right hand and also has some right hand pain.  He also has lower back pain which is not acute.  No fevers, chest pain, shortness of breath.  No abdominal pain, nausea and vomiting.  Wife voices concern about continued falls and slurred speech.  I asked about their goal today and his goal is to go home.  It looks like there was a plan to get home health and physical therapy set up for the patient, but the patient does not really seem interested in this.       Home Medications Prior to Admission medications   Medication Sig Start Date End Date Taking? Authorizing Provider  prednisoLONE acetate (PRED FORTE) 1 % ophthalmic suspension SMARTSIG:1 Drop(s) In Eye(s) Daily PRN 04/10/21   [provider]      Allergies    Aspirin    Review of Systems   Review of Systems  Physical Exam Updated Vital Signs BP 130/87 (BP Location: Right Arm)   Pulse 87   Temp 97.6 F (36.4 C) (Oral)   Resp 16   Ht 6' (1.829 m)   Wt 65.8 kg   SpO2 95%   BMI 19.67 kg/m  Physical Exam Vitals and nursing note reviewed.  Constitutional:      Appearance: He is well-developed.  HENT:      Head: Normocephalic and atraumatic.     Right Ear: Tympanic membrane, ear canal and external ear normal.     Left Ear: Tympanic membrane, ear canal and external ear normal.     Nose: Nose normal.     Mouth/Throat:     Mouth: Mucous membranes are moist.     Pharynx: Uvula midline.  Eyes:     General: Lids are normal.     Conjunctiva/sclera: Conjunctivae normal.     Pupils: Pupils are equal, round, and reactive to light.     Comments: Mild extraocular fasciculations  Cardiovascular:     Rate and Rhythm: Normal rate and regular rhythm.  Pulmonary:     Effort: Pulmonary effort is normal.     Breath sounds: Normal breath sounds.  Abdominal:     Palpations: Abdomen is soft.     Tenderness: There is no abdominal tenderness.  Musculoskeletal:        General: Normal range of motion.     Cervical back: Normal range of motion and neck supple. No tenderness or bony tenderness.  Skin:    General: Skin is warm and dry.  Neurological:     Mental Status: He is alert and oriented to person, place, and time.     GCS: GCS  eye subscore is 4. GCS verbal subscore is 5. GCS motor subscore is 6.     Cranial Nerves: No cranial nerve deficit.     Sensory: No sensory deficit.     Motor: Weakness present. No abnormal muscle tone.     Coordination: Coordination normal.     Comments: Mild generalized right upper and lower extremity weakness.  Patient is able to lift his right arm and leg up against gravity.     ED Results / Procedures / Treatments   Labs (all labs ordered are listed, but only abnormal results are displayed) Labs Reviewed - No data to display  EKG None  Radiology No results found.  Procedures Procedures  {Document cardiac monitor, telemetry assessment procedure when appropriate:1}  Medications Ordered in ED Medications - No data to display  ED Course/ Medical Decision Making/ A&P    Patient seen and examined. History obtained directly from patient and wife.  I also  reviewed outpatient neurology notes.  Patient has had a progressive right-sided spastic hemiplegia.  Unclear etiology but on last note, thought unlikely due to be Parkinson's.  There is a question of a lacunar infarct in the past.  Patient does not present for worsening strokelike symptoms today.  He did have a fall 2 days ago.  Family's concern seems to be most focused on his continued falls, weakness, and slurring of speech.  However patient has been unwilling to use assistive devices to help him walk and does not seem interested in therapy to help him get stronger.  He is not looking for placement today.  Labs/EKG: Ordered CBC, CMP, UA, EKG.  Imaging: Ordered x-ray of the hand.  Medications/Fluids: Ordered: P.o. Vicodin  Most recent vital signs reviewed and are as follows: BP 130/87 (BP Location: Right Arm)   Pulse 87   Temp 97.6 F (36.4 C) (Oral)   Resp 16   Ht 6' (1.829 m)   Wt 65.8 kg   SpO2 95%   BMI 19.67 kg/m   Initial impression: Progressive right-sided weakness, fall.  Discussed case with Dr. Karene Fry who will see. He has not had his thoracic or lumbar spine imaged recently.  Given reported back pain, triage note with poor control of bowels and bladder, will obtain MRI of thoracic and lumbar spine today to ensure no acute causes of worsening weakness.      {   Click here for ABCD2, HEART and other calculatorsREFRESH Note before signing :1}                              Medical Decision Making  ***  {Document critical care time when appropriate:1} {Document review of labs and clinical decision tools ie heart score, Chads2Vasc2 etc:1}  {Document your independent review of radiology images, and any outside records:1} {Document your discussion with family members, caretakers, and with consultants:1} {Document social determinants of health affecting pt's care:1} {Document your decision making why or why not admission, treatments were needed:1} Final Clinical  Impression(s) / ED Diagnoses Final diagnoses:  None    Rx / DC Orders ED Discharge Orders     None

## 2023-01-17 NOTE — ED Triage Notes (Signed)
Patient presents to ed c/o weakness in  his right arm and leg onset months ago, states he was seen by his PCP and has had 2 MIR's and echo was told he had Parkinson's. Was given medication. States he will be walking and his leg will just give away. Denies pain, states he also doesn't have control of his bowels and bladder.

## 2023-01-17 NOTE — ED Notes (Signed)
Assumed care of patient, pt in MRI at this time.

## 2023-03-03 DIAGNOSIS — L03113 Cellulitis of right upper limb: Secondary | ICD-10-CM | POA: Diagnosis not present

## 2023-03-03 DIAGNOSIS — S59901A Unspecified injury of right elbow, initial encounter: Secondary | ICD-10-CM | POA: Diagnosis not present

## 2023-03-03 DIAGNOSIS — M25521 Pain in right elbow: Secondary | ICD-10-CM | POA: Diagnosis not present

## 2023-03-03 DIAGNOSIS — W19XXXA Unspecified fall, initial encounter: Secondary | ICD-10-CM | POA: Diagnosis not present

## 2023-04-20 DIAGNOSIS — R413 Other amnesia: Secondary | ICD-10-CM | POA: Diagnosis not present

## 2023-04-20 DIAGNOSIS — R4781 Slurred speech: Secondary | ICD-10-CM | POA: Diagnosis not present

## 2023-04-20 DIAGNOSIS — R42 Dizziness and giddiness: Secondary | ICD-10-CM | POA: Diagnosis not present

## 2023-04-20 DIAGNOSIS — I6782 Cerebral ischemia: Secondary | ICD-10-CM | POA: Diagnosis not present

## 2023-04-20 DIAGNOSIS — R410 Disorientation, unspecified: Secondary | ICD-10-CM | POA: Diagnosis not present

## 2023-07-03 NOTE — Progress Notes (Signed)
 This encounter was created in error - please disregard.

## 2023-07-05 DIAGNOSIS — E7800 Pure hypercholesterolemia, unspecified: Secondary | ICD-10-CM | POA: Diagnosis not present

## 2023-07-05 DIAGNOSIS — R296 Repeated falls: Secondary | ICD-10-CM | POA: Diagnosis not present

## 2023-07-05 DIAGNOSIS — F419 Anxiety disorder, unspecified: Secondary | ICD-10-CM | POA: Diagnosis not present

## 2023-07-05 DIAGNOSIS — R911 Solitary pulmonary nodule: Secondary | ICD-10-CM | POA: Diagnosis not present

## 2023-07-05 DIAGNOSIS — M5031 Other cervical disc degeneration,  high cervical region: Secondary | ICD-10-CM | POA: Diagnosis not present

## 2023-07-05 DIAGNOSIS — S72142A Displaced intertrochanteric fracture of left femur, initial encounter for closed fracture: Secondary | ICD-10-CM | POA: Diagnosis not present

## 2023-07-05 DIAGNOSIS — S065X0A Traumatic subdural hemorrhage without loss of consciousness, initial encounter: Secondary | ICD-10-CM | POA: Diagnosis not present

## 2023-07-05 DIAGNOSIS — Z20822 Contact with and (suspected) exposure to covid-19: Secondary | ICD-10-CM | POA: Diagnosis not present

## 2023-07-05 DIAGNOSIS — M47812 Spondylosis without myelopathy or radiculopathy, cervical region: Secondary | ICD-10-CM | POA: Diagnosis not present

## 2023-07-05 DIAGNOSIS — S72115A Nondisplaced fracture of greater trochanter of left femur, initial encounter for closed fracture: Secondary | ICD-10-CM | POA: Diagnosis not present

## 2023-07-05 DIAGNOSIS — M25552 Pain in left hip: Secondary | ICD-10-CM | POA: Diagnosis not present

## 2023-07-05 DIAGNOSIS — W19XXXA Unspecified fall, initial encounter: Secondary | ICD-10-CM | POA: Diagnosis not present

## 2023-07-05 DIAGNOSIS — Z66 Do not resuscitate: Secondary | ICD-10-CM | POA: Diagnosis not present

## 2023-07-05 DIAGNOSIS — K7689 Other specified diseases of liver: Secondary | ICD-10-CM | POA: Diagnosis not present

## 2023-07-05 DIAGNOSIS — S72112A Displaced fracture of greater trochanter of left femur, initial encounter for closed fracture: Secondary | ICD-10-CM | POA: Diagnosis not present

## 2023-07-05 DIAGNOSIS — R930 Abnormal findings on diagnostic imaging of skull and head, not elsewhere classified: Secondary | ICD-10-CM | POA: Diagnosis not present

## 2023-07-05 DIAGNOSIS — Z87891 Personal history of nicotine dependence: Secondary | ICD-10-CM | POA: Diagnosis not present

## 2023-07-05 DIAGNOSIS — J449 Chronic obstructive pulmonary disease, unspecified: Secondary | ICD-10-CM | POA: Diagnosis not present

## 2023-07-05 DIAGNOSIS — Z043 Encounter for examination and observation following other accident: Secondary | ICD-10-CM | POA: Diagnosis not present

## 2023-07-05 DIAGNOSIS — R531 Weakness: Secondary | ICD-10-CM | POA: Diagnosis not present

## 2023-07-06 DIAGNOSIS — G9389 Other specified disorders of brain: Secondary | ICD-10-CM | POA: Diagnosis not present

## 2023-07-06 DIAGNOSIS — S72112A Displaced fracture of greater trochanter of left femur, initial encounter for closed fracture: Secondary | ICD-10-CM | POA: Diagnosis not present

## 2023-07-06 DIAGNOSIS — R410 Disorientation, unspecified: Secondary | ICD-10-CM | POA: Diagnosis not present

## 2023-07-06 DIAGNOSIS — Z743 Need for continuous supervision: Secondary | ICD-10-CM | POA: Diagnosis not present

## 2023-07-07 ENCOUNTER — Telehealth: Payer: Self-pay

## 2023-07-07 NOTE — Transitions of Care (Post Inpatient/ED Visit) (Unsigned)
   07/07/2023  Name: Jonathan Burnett MRN: [id number] DOB: May 03, 1950  Today's TOC FU Call Status: Today's TOC FU Call Status:: Unsuccessful Call (1st Attempt) Unsuccessful Call (1st Attempt) Date: 07/07/23  Attempted to reach the patient regarding the most recent Inpatient/ED visit.  Follow Up Plan: Additional outreach attempts will be made to reach the patient to complete the Transitions of Care (Post Inpatient/ED visit) call.   Signature Karena Addison, LPN [facility] Nurse Health Advisor Direct Dial [phone]

## 2023-07-08 DIAGNOSIS — G8111 Spastic hemiplegia affecting right dominant side: Secondary | ICD-10-CM | POA: Diagnosis not present

## 2023-07-08 DIAGNOSIS — B0051 Herpesviral iridocyclitis: Secondary | ICD-10-CM | POA: Diagnosis not present

## 2023-07-08 DIAGNOSIS — E785 Hyperlipidemia, unspecified: Secondary | ICD-10-CM | POA: Diagnosis not present

## 2023-07-08 DIAGNOSIS — F419 Anxiety disorder, unspecified: Secondary | ICD-10-CM | POA: Diagnosis not present

## 2023-07-08 DIAGNOSIS — S065X0S Traumatic subdural hemorrhage without loss of consciousness, sequela: Secondary | ICD-10-CM | POA: Diagnosis not present

## 2023-07-08 DIAGNOSIS — M5030 Other cervical disc degeneration, unspecified cervical region: Secondary | ICD-10-CM | POA: Diagnosis not present

## 2023-07-08 DIAGNOSIS — G629 Polyneuropathy, unspecified: Secondary | ICD-10-CM | POA: Diagnosis not present

## 2023-07-08 DIAGNOSIS — J984 Other disorders of lung: Secondary | ICD-10-CM | POA: Diagnosis not present

## 2023-07-08 DIAGNOSIS — S72115S Nondisplaced fracture of greater trochanter of left femur, sequela: Secondary | ICD-10-CM | POA: Diagnosis not present

## 2023-07-08 DIAGNOSIS — F411 Generalized anxiety disorder: Secondary | ICD-10-CM | POA: Diagnosis not present

## 2023-07-08 DIAGNOSIS — S72115A Nondisplaced fracture of greater trochanter of left femur, initial encounter for closed fracture: Secondary | ICD-10-CM | POA: Diagnosis not present

## 2023-07-08 DIAGNOSIS — F32A Depression, unspecified: Secondary | ICD-10-CM | POA: Diagnosis not present

## 2023-07-08 DIAGNOSIS — H209 Unspecified iridocyclitis: Secondary | ICD-10-CM | POA: Diagnosis not present

## 2023-07-08 DIAGNOSIS — R262 Difficulty in walking, not elsewhere classified: Secondary | ICD-10-CM | POA: Diagnosis not present

## 2023-07-08 DIAGNOSIS — B0052 Herpesviral keratitis: Secondary | ICD-10-CM | POA: Diagnosis not present

## 2023-07-08 DIAGNOSIS — H3020 Posterior cyclitis, unspecified eye: Secondary | ICD-10-CM | POA: Diagnosis not present

## 2023-07-08 DIAGNOSIS — R252 Cramp and spasm: Secondary | ICD-10-CM | POA: Diagnosis not present

## 2023-07-08 DIAGNOSIS — R471 Dysarthria and anarthria: Secondary | ICD-10-CM | POA: Diagnosis not present

## 2023-07-08 NOTE — Transitions of Care (Post Inpatient/ED Visit) (Unsigned)
   07/08/2023  Name: Jonathan Burnett MRN: [id number] DOB: 1950-10-07  Today's TOC FU Call Status: Today's TOC FU Call Status:: Unsuccessful Call (2nd Attempt) Unsuccessful Call (1st Attempt) Date: 07/07/23 Unsuccessful Call (2nd Attempt) Date: 07/08/23  Attempted to reach the patient regarding the most recent Inpatient/ED visit.  Follow Up Plan: Additional outreach attempts will be made to reach the patient to complete the Transitions of Care (Post Inpatient/ED visit) call.   Signature Karena Addison, LPN [facility] Nurse Health Advisor Direct Dial [phone]

## 2023-07-09 NOTE — Transitions of Care (Post Inpatient/ED Visit) (Signed)
   07/09/2023  Name: Jonathan Burnett MRN: [id number] DOB: [birth year]  Today's TOC FU Call Status: Today's TOC FU Call Status:: Unsuccessful Call (3rd Attempt) Unsuccessful Call (1st Attempt) Date: 07/07/23 Unsuccessful Call (2nd Attempt) Date: 07/08/23 Unsuccessful Call (3rd Attempt) Date: 07/09/23  Attempted to reach the patient regarding the most recent Inpatient/ED visit.  Follow Up Plan: No further outreach attempts will be made at this time. We have been unable to contact the patient.  Signature Karena Addison, LPN [facility] Nurse Health Advisor Direct Dial [phone]

## 2023-07-10 DIAGNOSIS — M1990 Unspecified osteoarthritis, unspecified site: Secondary | ICD-10-CM | POA: Diagnosis not present

## 2023-07-10 DIAGNOSIS — E7800 Pure hypercholesterolemia, unspecified: Secondary | ICD-10-CM | POA: Diagnosis not present

## 2023-07-10 DIAGNOSIS — H269 Unspecified cataract: Secondary | ICD-10-CM | POA: Diagnosis not present

## 2023-07-10 DIAGNOSIS — E119 Type 2 diabetes mellitus without complications: Secondary | ICD-10-CM | POA: Diagnosis not present

## 2023-07-10 DIAGNOSIS — I10 Essential (primary) hypertension: Secondary | ICD-10-CM | POA: Diagnosis not present

## 2023-07-10 DIAGNOSIS — F419 Anxiety disorder, unspecified: Secondary | ICD-10-CM | POA: Diagnosis not present

## 2023-07-10 DIAGNOSIS — E785 Hyperlipidemia, unspecified: Secondary | ICD-10-CM | POA: Diagnosis not present

## 2023-07-10 DIAGNOSIS — E559 Vitamin D deficiency, unspecified: Secondary | ICD-10-CM | POA: Diagnosis not present

## 2023-07-10 DIAGNOSIS — G8111 Spastic hemiplegia affecting right dominant side: Secondary | ICD-10-CM | POA: Diagnosis not present

## 2023-07-14 DIAGNOSIS — I69922 Dysarthria following unspecified cerebrovascular disease: Secondary | ICD-10-CM | POA: Diagnosis not present

## 2023-07-14 DIAGNOSIS — R296 Repeated falls: Secondary | ICD-10-CM | POA: Diagnosis not present

## 2023-07-14 DIAGNOSIS — S72115S Nondisplaced fracture of greater trochanter of left femur, sequela: Secondary | ICD-10-CM | POA: Diagnosis not present

## 2023-07-14 DIAGNOSIS — H0012 Chalazion right lower eyelid: Secondary | ICD-10-CM | POA: Diagnosis not present

## 2023-07-14 DIAGNOSIS — B0052 Herpesviral keratitis: Secondary | ICD-10-CM | POA: Diagnosis not present

## 2023-07-14 DIAGNOSIS — I69991 Dysphagia following unspecified cerebrovascular disease: Secondary | ICD-10-CM | POA: Diagnosis not present

## 2023-07-14 DIAGNOSIS — R262 Difficulty in walking, not elsewhere classified: Secondary | ICD-10-CM | POA: Diagnosis not present

## 2023-07-14 DIAGNOSIS — I69928 Other speech and language deficits following unspecified cerebrovascular disease: Secondary | ICD-10-CM | POA: Diagnosis not present

## 2023-07-14 DIAGNOSIS — F32A Depression, unspecified: Secondary | ICD-10-CM | POA: Diagnosis not present

## 2023-07-14 DIAGNOSIS — S72115A Nondisplaced fracture of greater trochanter of left femur, initial encounter for closed fracture: Secondary | ICD-10-CM | POA: Diagnosis not present

## 2023-07-14 DIAGNOSIS — M5030 Other cervical disc degeneration, unspecified cervical region: Secondary | ICD-10-CM | POA: Diagnosis not present

## 2023-07-14 DIAGNOSIS — S72115D Nondisplaced fracture of greater trochanter of left femur, subsequent encounter for closed fracture with routine healing: Secondary | ICD-10-CM | POA: Diagnosis not present

## 2023-07-14 DIAGNOSIS — G629 Polyneuropathy, unspecified: Secondary | ICD-10-CM | POA: Diagnosis not present

## 2023-07-14 DIAGNOSIS — F419 Anxiety disorder, unspecified: Secondary | ICD-10-CM | POA: Diagnosis not present

## 2023-07-14 DIAGNOSIS — I69851 Hemiplegia and hemiparesis following other cerebrovascular disease affecting right dominant side: Secondary | ICD-10-CM | POA: Diagnosis not present

## 2023-07-14 DIAGNOSIS — F331 Major depressive disorder, recurrent, moderate: Secondary | ICD-10-CM | POA: Diagnosis not present

## 2023-07-14 DIAGNOSIS — H269 Unspecified cataract: Secondary | ICD-10-CM | POA: Diagnosis not present

## 2023-07-14 DIAGNOSIS — R471 Dysarthria and anarthria: Secondary | ICD-10-CM | POA: Diagnosis not present

## 2023-07-14 DIAGNOSIS — Z741 Need for assistance with personal care: Secondary | ICD-10-CM | POA: Diagnosis not present

## 2023-07-14 DIAGNOSIS — R2689 Other abnormalities of gait and mobility: Secondary | ICD-10-CM | POA: Diagnosis not present

## 2023-07-14 DIAGNOSIS — B0051 Herpesviral iridocyclitis: Secondary | ICD-10-CM | POA: Diagnosis not present

## 2023-07-14 DIAGNOSIS — G8111 Spastic hemiplegia affecting right dominant side: Secondary | ICD-10-CM | POA: Diagnosis not present

## 2023-07-14 DIAGNOSIS — E785 Hyperlipidemia, unspecified: Secondary | ICD-10-CM | POA: Diagnosis not present

## 2023-07-14 DIAGNOSIS — J984 Other disorders of lung: Secondary | ICD-10-CM | POA: Diagnosis not present

## 2023-07-14 DIAGNOSIS — M6281 Muscle weakness (generalized): Secondary | ICD-10-CM | POA: Diagnosis not present

## 2023-07-14 DIAGNOSIS — M1990 Unspecified osteoarthritis, unspecified site: Secondary | ICD-10-CM | POA: Diagnosis not present

## 2023-07-14 DIAGNOSIS — E7800 Pure hypercholesterolemia, unspecified: Secondary | ICD-10-CM | POA: Diagnosis not present

## 2023-07-14 DIAGNOSIS — S51812A Laceration without foreign body of left forearm, initial encounter: Secondary | ICD-10-CM | POA: Diagnosis not present

## 2023-07-14 DIAGNOSIS — H3020 Posterior cyclitis, unspecified eye: Secondary | ICD-10-CM | POA: Diagnosis not present

## 2023-07-14 DIAGNOSIS — I69911 Memory deficit following unspecified cerebrovascular disease: Secondary | ICD-10-CM | POA: Diagnosis not present

## 2023-07-14 DIAGNOSIS — F411 Generalized anxiety disorder: Secondary | ICD-10-CM | POA: Diagnosis not present

## 2023-07-14 DIAGNOSIS — S065X0S Traumatic subdural hemorrhage without loss of consciousness, sequela: Secondary | ICD-10-CM | POA: Diagnosis not present

## 2023-07-14 DIAGNOSIS — M25552 Pain in left hip: Secondary | ICD-10-CM | POA: Diagnosis not present

## 2023-07-15 DIAGNOSIS — M25552 Pain in left hip: Secondary | ICD-10-CM | POA: Diagnosis not present

## 2023-07-15 DIAGNOSIS — F411 Generalized anxiety disorder: Secondary | ICD-10-CM | POA: Diagnosis not present

## 2023-07-15 DIAGNOSIS — F331 Major depressive disorder, recurrent, moderate: Secondary | ICD-10-CM | POA: Diagnosis not present

## 2023-07-16 DIAGNOSIS — B0051 Herpesviral iridocyclitis: Secondary | ICD-10-CM | POA: Diagnosis not present

## 2023-07-16 DIAGNOSIS — R262 Difficulty in walking, not elsewhere classified: Secondary | ICD-10-CM | POA: Diagnosis not present

## 2023-07-16 DIAGNOSIS — G8111 Spastic hemiplegia affecting right dominant side: Secondary | ICD-10-CM | POA: Diagnosis not present

## 2023-07-16 DIAGNOSIS — B0052 Herpesviral keratitis: Secondary | ICD-10-CM | POA: Diagnosis not present

## 2023-07-16 DIAGNOSIS — R471 Dysarthria and anarthria: Secondary | ICD-10-CM | POA: Diagnosis not present

## 2023-07-16 DIAGNOSIS — H3020 Posterior cyclitis, unspecified eye: Secondary | ICD-10-CM | POA: Diagnosis not present

## 2023-07-16 DIAGNOSIS — S72115A Nondisplaced fracture of greater trochanter of left femur, initial encounter for closed fracture: Secondary | ICD-10-CM | POA: Diagnosis not present

## 2023-07-20 DIAGNOSIS — G629 Polyneuropathy, unspecified: Secondary | ICD-10-CM | POA: Diagnosis not present

## 2023-07-20 DIAGNOSIS — R296 Repeated falls: Secondary | ICD-10-CM | POA: Diagnosis not present

## 2023-07-20 DIAGNOSIS — F411 Generalized anxiety disorder: Secondary | ICD-10-CM | POA: Diagnosis not present

## 2023-07-20 DIAGNOSIS — G8111 Spastic hemiplegia affecting right dominant side: Secondary | ICD-10-CM | POA: Diagnosis not present

## 2023-07-20 DIAGNOSIS — S72115S Nondisplaced fracture of greater trochanter of left femur, sequela: Secondary | ICD-10-CM | POA: Diagnosis not present

## 2023-07-24 DIAGNOSIS — B0051 Herpesviral iridocyclitis: Secondary | ICD-10-CM | POA: Diagnosis not present

## 2023-07-24 DIAGNOSIS — R262 Difficulty in walking, not elsewhere classified: Secondary | ICD-10-CM | POA: Diagnosis not present

## 2023-07-24 DIAGNOSIS — S72115A Nondisplaced fracture of greater trochanter of left femur, initial encounter for closed fracture: Secondary | ICD-10-CM | POA: Diagnosis not present

## 2023-07-24 DIAGNOSIS — R471 Dysarthria and anarthria: Secondary | ICD-10-CM | POA: Diagnosis not present

## 2023-07-24 DIAGNOSIS — H3020 Posterior cyclitis, unspecified eye: Secondary | ICD-10-CM | POA: Diagnosis not present

## 2023-07-24 DIAGNOSIS — B0052 Herpesviral keratitis: Secondary | ICD-10-CM | POA: Diagnosis not present

## 2023-07-24 DIAGNOSIS — G8111 Spastic hemiplegia affecting right dominant side: Secondary | ICD-10-CM | POA: Diagnosis not present

## 2023-07-24 DIAGNOSIS — S72115D Nondisplaced fracture of greater trochanter of left femur, subsequent encounter for closed fracture with routine healing: Secondary | ICD-10-CM | POA: Diagnosis not present

## 2023-07-29 DIAGNOSIS — E7800 Pure hypercholesterolemia, unspecified: Secondary | ICD-10-CM | POA: Diagnosis not present

## 2023-07-29 DIAGNOSIS — F419 Anxiety disorder, unspecified: Secondary | ICD-10-CM | POA: Diagnosis not present

## 2023-07-29 DIAGNOSIS — E785 Hyperlipidemia, unspecified: Secondary | ICD-10-CM | POA: Diagnosis not present

## 2023-07-29 DIAGNOSIS — M1990 Unspecified osteoarthritis, unspecified site: Secondary | ICD-10-CM | POA: Diagnosis not present

## 2023-07-29 DIAGNOSIS — H269 Unspecified cataract: Secondary | ICD-10-CM | POA: Diagnosis not present

## 2023-07-30 DIAGNOSIS — B0052 Herpesviral keratitis: Secondary | ICD-10-CM | POA: Diagnosis not present

## 2023-07-30 DIAGNOSIS — B0051 Herpesviral iridocyclitis: Secondary | ICD-10-CM | POA: Diagnosis not present

## 2023-07-30 DIAGNOSIS — G8111 Spastic hemiplegia affecting right dominant side: Secondary | ICD-10-CM | POA: Diagnosis not present

## 2023-07-30 DIAGNOSIS — R262 Difficulty in walking, not elsewhere classified: Secondary | ICD-10-CM | POA: Diagnosis not present

## 2023-07-30 DIAGNOSIS — S72115A Nondisplaced fracture of greater trochanter of left femur, initial encounter for closed fracture: Secondary | ICD-10-CM | POA: Diagnosis not present

## 2023-07-30 DIAGNOSIS — H3020 Posterior cyclitis, unspecified eye: Secondary | ICD-10-CM | POA: Diagnosis not present

## 2023-07-30 DIAGNOSIS — R471 Dysarthria and anarthria: Secondary | ICD-10-CM | POA: Diagnosis not present

## 2023-08-03 DIAGNOSIS — G8111 Spastic hemiplegia affecting right dominant side: Secondary | ICD-10-CM | POA: Diagnosis not present

## 2023-08-03 DIAGNOSIS — F411 Generalized anxiety disorder: Secondary | ICD-10-CM | POA: Diagnosis not present

## 2023-08-03 DIAGNOSIS — G629 Polyneuropathy, unspecified: Secondary | ICD-10-CM | POA: Diagnosis not present

## 2023-08-03 DIAGNOSIS — M5030 Other cervical disc degeneration, unspecified cervical region: Secondary | ICD-10-CM | POA: Diagnosis not present

## 2023-08-07 DIAGNOSIS — F411 Generalized anxiety disorder: Secondary | ICD-10-CM | POA: Diagnosis not present

## 2023-08-07 DIAGNOSIS — G8111 Spastic hemiplegia affecting right dominant side: Secondary | ICD-10-CM | POA: Diagnosis not present

## 2023-08-07 DIAGNOSIS — M5030 Other cervical disc degeneration, unspecified cervical region: Secondary | ICD-10-CM | POA: Diagnosis not present

## 2023-08-07 DIAGNOSIS — R296 Repeated falls: Secondary | ICD-10-CM | POA: Diagnosis not present

## 2023-08-07 DIAGNOSIS — G629 Polyneuropathy, unspecified: Secondary | ICD-10-CM | POA: Diagnosis not present

## 2023-08-13 DIAGNOSIS — B0051 Herpesviral iridocyclitis: Secondary | ICD-10-CM | POA: Diagnosis not present

## 2023-08-13 DIAGNOSIS — S72115A Nondisplaced fracture of greater trochanter of left femur, initial encounter for closed fracture: Secondary | ICD-10-CM | POA: Diagnosis not present

## 2023-08-13 DIAGNOSIS — B0052 Herpesviral keratitis: Secondary | ICD-10-CM | POA: Diagnosis not present

## 2023-08-13 DIAGNOSIS — R262 Difficulty in walking, not elsewhere classified: Secondary | ICD-10-CM | POA: Diagnosis not present

## 2023-08-13 DIAGNOSIS — R471 Dysarthria and anarthria: Secondary | ICD-10-CM | POA: Diagnosis not present

## 2023-08-13 DIAGNOSIS — H3020 Posterior cyclitis, unspecified eye: Secondary | ICD-10-CM | POA: Diagnosis not present

## 2023-08-13 DIAGNOSIS — G8111 Spastic hemiplegia affecting right dominant side: Secondary | ICD-10-CM | POA: Diagnosis not present

## 2023-08-16 DIAGNOSIS — R296 Repeated falls: Secondary | ICD-10-CM | POA: Diagnosis not present

## 2023-08-16 DIAGNOSIS — F411 Generalized anxiety disorder: Secondary | ICD-10-CM | POA: Diagnosis not present

## 2023-08-16 DIAGNOSIS — G8111 Spastic hemiplegia affecting right dominant side: Secondary | ICD-10-CM | POA: Diagnosis not present

## 2023-08-16 DIAGNOSIS — G629 Polyneuropathy, unspecified: Secondary | ICD-10-CM | POA: Diagnosis not present

## 2023-08-16 DIAGNOSIS — S065X0S Traumatic subdural hemorrhage without loss of consciousness, sequela: Secondary | ICD-10-CM | POA: Diagnosis not present

## 2023-08-18 DIAGNOSIS — F411 Generalized anxiety disorder: Secondary | ICD-10-CM | POA: Diagnosis not present

## 2023-08-18 DIAGNOSIS — M5030 Other cervical disc degeneration, unspecified cervical region: Secondary | ICD-10-CM | POA: Diagnosis not present

## 2023-08-18 DIAGNOSIS — G629 Polyneuropathy, unspecified: Secondary | ICD-10-CM | POA: Diagnosis not present

## 2023-08-18 DIAGNOSIS — S065X0S Traumatic subdural hemorrhage without loss of consciousness, sequela: Secondary | ICD-10-CM | POA: Diagnosis not present

## 2023-08-19 DIAGNOSIS — F32A Depression, unspecified: Secondary | ICD-10-CM | POA: Diagnosis not present

## 2023-08-19 DIAGNOSIS — F419 Anxiety disorder, unspecified: Secondary | ICD-10-CM | POA: Diagnosis not present

## 2023-08-20 DIAGNOSIS — S51812A Laceration without foreign body of left forearm, initial encounter: Secondary | ICD-10-CM | POA: Diagnosis not present

## 2023-08-20 DIAGNOSIS — E785 Hyperlipidemia, unspecified: Secondary | ICD-10-CM | POA: Diagnosis not present

## 2023-08-20 DIAGNOSIS — G8111 Spastic hemiplegia affecting right dominant side: Secondary | ICD-10-CM | POA: Diagnosis not present

## 2023-08-21 DIAGNOSIS — H3020 Posterior cyclitis, unspecified eye: Secondary | ICD-10-CM | POA: Diagnosis not present

## 2023-08-21 DIAGNOSIS — B0052 Herpesviral keratitis: Secondary | ICD-10-CM | POA: Diagnosis not present

## 2023-08-21 DIAGNOSIS — R471 Dysarthria and anarthria: Secondary | ICD-10-CM | POA: Diagnosis not present

## 2023-08-21 DIAGNOSIS — S72115A Nondisplaced fracture of greater trochanter of left femur, initial encounter for closed fracture: Secondary | ICD-10-CM | POA: Diagnosis not present

## 2023-08-21 DIAGNOSIS — G8111 Spastic hemiplegia affecting right dominant side: Secondary | ICD-10-CM | POA: Diagnosis not present

## 2023-08-21 DIAGNOSIS — R262 Difficulty in walking, not elsewhere classified: Secondary | ICD-10-CM | POA: Diagnosis not present

## 2023-08-21 DIAGNOSIS — B0051 Herpesviral iridocyclitis: Secondary | ICD-10-CM | POA: Diagnosis not present

## 2023-08-24 DIAGNOSIS — H269 Unspecified cataract: Secondary | ICD-10-CM | POA: Diagnosis not present

## 2023-08-24 DIAGNOSIS — E7800 Pure hypercholesterolemia, unspecified: Secondary | ICD-10-CM | POA: Diagnosis not present

## 2023-08-24 DIAGNOSIS — F419 Anxiety disorder, unspecified: Secondary | ICD-10-CM | POA: Diagnosis not present

## 2023-08-24 DIAGNOSIS — E785 Hyperlipidemia, unspecified: Secondary | ICD-10-CM | POA: Diagnosis not present

## 2023-08-24 DIAGNOSIS — M1990 Unspecified osteoarthritis, unspecified site: Secondary | ICD-10-CM | POA: Diagnosis not present

## 2023-08-25 DIAGNOSIS — M5030 Other cervical disc degeneration, unspecified cervical region: Secondary | ICD-10-CM | POA: Diagnosis not present

## 2023-08-25 DIAGNOSIS — H0012 Chalazion right lower eyelid: Secondary | ICD-10-CM | POA: Diagnosis not present

## 2023-08-25 DIAGNOSIS — F411 Generalized anxiety disorder: Secondary | ICD-10-CM | POA: Diagnosis not present

## 2023-08-25 DIAGNOSIS — G629 Polyneuropathy, unspecified: Secondary | ICD-10-CM | POA: Diagnosis not present

## 2023-08-27 DIAGNOSIS — H3020 Posterior cyclitis, unspecified eye: Secondary | ICD-10-CM | POA: Diagnosis not present

## 2023-08-27 DIAGNOSIS — G8111 Spastic hemiplegia affecting right dominant side: Secondary | ICD-10-CM | POA: Diagnosis not present

## 2023-08-27 DIAGNOSIS — B0051 Herpesviral iridocyclitis: Secondary | ICD-10-CM | POA: Diagnosis not present

## 2023-08-27 DIAGNOSIS — B0052 Herpesviral keratitis: Secondary | ICD-10-CM | POA: Diagnosis not present

## 2023-08-27 DIAGNOSIS — S72115A Nondisplaced fracture of greater trochanter of left femur, initial encounter for closed fracture: Secondary | ICD-10-CM | POA: Diagnosis not present

## 2023-08-27 DIAGNOSIS — R262 Difficulty in walking, not elsewhere classified: Secondary | ICD-10-CM | POA: Diagnosis not present

## 2023-08-27 DIAGNOSIS — R471 Dysarthria and anarthria: Secondary | ICD-10-CM | POA: Diagnosis not present

## 2023-09-01 DIAGNOSIS — M5030 Other cervical disc degeneration, unspecified cervical region: Secondary | ICD-10-CM | POA: Diagnosis not present

## 2023-09-01 DIAGNOSIS — G629 Polyneuropathy, unspecified: Secondary | ICD-10-CM | POA: Diagnosis not present

## 2023-09-01 DIAGNOSIS — F411 Generalized anxiety disorder: Secondary | ICD-10-CM | POA: Diagnosis not present

## 2023-09-01 DIAGNOSIS — H0012 Chalazion right lower eyelid: Secondary | ICD-10-CM | POA: Diagnosis not present

## 2023-09-02 ENCOUNTER — Emergency Department (HOSPITAL_COMMUNITY)
Admission: EM | Admit: 2023-09-02 | Discharge: 2023-09-03 | Disposition: A | Discharge status: Skilled Nursing Facility | Attending: Emergency Medicine | Admitting: Emergency Medicine

## 2023-09-02 ENCOUNTER — Other Ambulatory Visit: Payer: Self-pay

## 2023-09-02 ENCOUNTER — Emergency Department (HOSPITAL_COMMUNITY)

## 2023-09-02 DIAGNOSIS — R220 Localized swelling, mass and lump, head: Secondary | ICD-10-CM | POA: Diagnosis not present

## 2023-09-02 DIAGNOSIS — S0001XA Abrasion of scalp, initial encounter: Secondary | ICD-10-CM | POA: Diagnosis not present

## 2023-09-02 DIAGNOSIS — W19XXXA Unspecified fall, initial encounter: Secondary | ICD-10-CM

## 2023-09-02 DIAGNOSIS — S199XXA Unspecified injury of neck, initial encounter: Secondary | ICD-10-CM | POA: Diagnosis not present

## 2023-09-02 DIAGNOSIS — S59919A Unspecified injury of unspecified forearm, initial encounter: Secondary | ICD-10-CM | POA: Diagnosis not present

## 2023-09-02 DIAGNOSIS — J449 Chronic obstructive pulmonary disease, unspecified: Secondary | ICD-10-CM | POA: Insufficient documentation

## 2023-09-02 DIAGNOSIS — J439 Emphysema, unspecified: Secondary | ICD-10-CM | POA: Diagnosis not present

## 2023-09-02 DIAGNOSIS — F0390 Unspecified dementia without behavioral disturbance: Secondary | ICD-10-CM | POA: Insufficient documentation

## 2023-09-02 DIAGNOSIS — W01198A Fall on same level from slipping, tripping and stumbling with subsequent striking against other object, initial encounter: Secondary | ICD-10-CM | POA: Diagnosis not present

## 2023-09-02 DIAGNOSIS — S5011XA Contusion of right forearm, initial encounter: Secondary | ICD-10-CM | POA: Diagnosis not present

## 2023-09-02 DIAGNOSIS — S0003XA Contusion of scalp, initial encounter: Secondary | ICD-10-CM | POA: Insufficient documentation

## 2023-09-02 DIAGNOSIS — S0990XA Unspecified injury of head, initial encounter: Secondary | ICD-10-CM | POA: Diagnosis not present

## 2023-09-02 DIAGNOSIS — S50811A Abrasion of right forearm, initial encounter: Secondary | ICD-10-CM | POA: Diagnosis not present

## 2023-09-02 DIAGNOSIS — I10 Essential (primary) hypertension: Secondary | ICD-10-CM | POA: Diagnosis not present

## 2023-09-02 NOTE — ED Notes (Signed)
 Ptar called

## 2023-09-02 NOTE — ED Notes (Signed)
 Pt taken to CT.

## 2023-09-02 NOTE — ED Notes (Signed)
 Attempted to call [facility] for report, no answer.

## 2023-09-02 NOTE — ED Triage Notes (Addendum)
 Mechanical fall, hit his head. Pain to right elbow/forearm. No thinners. No LOC. No deformity to his head.

## 2023-09-02 NOTE — ED Provider Notes (Signed)
 [place] EMERGENCY DEPARTMENT AT [facility] Provider Note   CSN: [id number] Arrival date & time: 09/02/23  1751     History  Chief Complaint  Patient presents with   Jonathan Burnett    Jonathan Burnett is a 73 y.o. male.  Patient is a 73 year old male with a past medical history of dementia, COPD, hyperlipidemia presenting to the emergency department after a fall.  Per EMS, he had a witnessed fall at his facility.  They reported that he tripped and fell backwards and hit the back of his head.  Denies any loss of consciousness.  Also report an abrasion to his right forearm.  The patient states that he had no lightheadedness or dizziness prior to the fall.  He denies any headache or pain in his arm.  He denies any nausea, vomiting, numbness or weakness.   Fall       Home Medications Prior to Admission medications   Medication Sig Start Date End Date Taking? Authorizing Provider  HYDROcodone -acetaminophen  (NORCO/VICODIN) 5-325 MG tablet Take 1 tablet by mouth every 6 (six) hours as needed for severe pain. 01/17/23   Geiple, Joshua, PA-C  prednisoLONE acetate (PRED FORTE) 1 % ophthalmic suspension SMARTSIG:1 Drop(s) In Eye(s) Daily PRN 04/10/21   [provider]      Allergies    Aspirin    Review of Systems   Review of Systems  Physical Exam Updated Vital Signs BP (!) 143/94   Pulse 82   Temp 98.4 F (36.9 C)   Resp 16   Ht 6' (1.829 m)   Wt 65 kg   SpO2 99%   BMI 19.43 kg/m  Physical Exam Vitals and nursing note reviewed.  Constitutional:      General: He is not in acute distress.    Appearance: Normal appearance.  HENT:     Head: Normocephalic.     Comments: Hematoma to right posterior scalp    Nose: Nose normal.     Mouth/Throat:     Mouth: Mucous membranes are moist.     Pharynx: Oropharynx is clear.  Eyes:     Extraocular Movements: Extraocular movements intact.     Conjunctiva/sclera: Conjunctivae normal.     Pupils: Pupils are equal, round,  and reactive to light.  Neck:     Comments: No midline neck tenderness Cardiovascular:     Rate and Rhythm: Normal rate and regular rhythm.     Heart sounds: Normal heart sounds.  Pulmonary:     Effort: Pulmonary effort is normal.     Breath sounds: Normal breath sounds.  Abdominal:     General: Abdomen is flat.     Palpations: Abdomen is soft.     Tenderness: There is no abdominal tenderness.  Musculoskeletal:        General: Normal range of motion.     Cervical back: Normal range of motion and neck supple.     Comments: No midline back tenderness No bony tenderness of bilateral upper or lower extremities Pelvis stable, nontender  Skin:    General: Skin is warm and dry.     Comments: Small nonbleeding abrasion to posterior distal right forearm  Neurological:     General: No focal deficit present.     Mental Status: He is alert. Mental status is at baseline.     Sensory: No sensory deficit.     Motor: No weakness.  Psychiatric:        Mood and Affect: Mood normal.  Behavior: Behavior normal.     ED Results / Procedures / Treatments   Labs (all labs ordered are listed, but only abnormal results are displayed) Labs Reviewed - No data to display  EKG None  Radiology CT Head Wo Contrast Result Date: 09/02/2023 CLINICAL DATA:  Head trauma, minor (Age >= 65y); Neck trauma (Age >= 65y). Fall EXAM: CT HEAD WITHOUT CONTRAST CT CERVICAL SPINE WITHOUT CONTRAST TECHNIQUE: Multidetector CT imaging of the head and cervical spine was performed following the standard protocol without intravenous contrast. Multiplanar CT image reconstructions of the cervical spine were also generated. RADIATION DOSE REDUCTION: This exam was performed according to the departmental dose-optimization program which includes automated exposure control, adjustment of the mA and/or kV according to patient size and/or use of iterative reconstruction technique. COMPARISON:  None Available. FINDINGS: CT HEAD  FINDINGS Brain: Cavum septum pellucidum. Parenchymal volume loss is commensurate with the patient's age. No abnormal intra or extra-axial mass lesion or fluid collection. No abnormal mass effect or midline shift. No evidence of acute intracranial hemorrhage or infarct. Ventricular size is normal. Cerebellum unremarkable. Vascular: No asymmetric hyperdense vasculature at the skull base. Skull: Intact Sinuses/Orbits: Moderate mucosal thickening throughout the paranasal sinuses. No air-fluid levels. Orbits are unremarkable. Other: Mastoid air cells and middle ear cavities are clear. Small right occipital scalp soft tissue swelling. CT CERVICAL SPINE FINDINGS Alignment: Normal. Skull base and vertebrae: No acute fracture. No primary bone lesion or focal pathologic process. Soft tissues and spinal canal: No prevertebral fluid or swelling. No visible canal hematoma. Disc levels: There is disc space narrowing and endplate remodeling at C5-C7, most severe at C5-6 in keeping with changes of moderate degenerative disc disease. Prevertebral soft tissues are not thickened on sagittal reformats. Broad-based disc bulges are noted throughout the cervical spine without significant canal stenosis. Uncovertebral arthrosis results in moderate to severe bilateral neuroforaminal narrowing at C5-C7. Upper chest: Emphysema Other: None IMPRESSION: 1. Small right occipital scalp soft tissue swelling. 2. No acute intracranial abnormality. No calvarial fracture. 3. No acute fracture or malalignment of the cervical spine. 4. Moderate degenerative disc disease at C5-C7. Moderate to severe bilateral neuroforaminal narrowing at C5-C7. Electronically Signed   By: Worthy Heads M.D.   On: 09/02/2023 20:58   CT Cervical Spine Wo Contrast Result Date: 09/02/2023 CLINICAL DATA:  Head trauma, minor (Age >= 65y); Neck trauma (Age >= 65y). Fall EXAM: CT HEAD WITHOUT CONTRAST CT CERVICAL SPINE WITHOUT CONTRAST TECHNIQUE: Multidetector CT imaging of  the head and cervical spine was performed following the standard protocol without intravenous contrast. Multiplanar CT image reconstructions of the cervical spine were also generated. RADIATION DOSE REDUCTION: This exam was performed according to the departmental dose-optimization program which includes automated exposure control, adjustment of the mA and/or kV according to patient size and/or use of iterative reconstruction technique. COMPARISON:  None Available. FINDINGS: CT HEAD FINDINGS Brain: Cavum septum pellucidum. Parenchymal volume loss is commensurate with the patient's age. No abnormal intra or extra-axial mass lesion or fluid collection. No abnormal mass effect or midline shift. No evidence of acute intracranial hemorrhage or infarct. Ventricular size is normal. Cerebellum unremarkable. Vascular: No asymmetric hyperdense vasculature at the skull base. Skull: Intact Sinuses/Orbits: Moderate mucosal thickening throughout the paranasal sinuses. No air-fluid levels. Orbits are unremarkable. Other: Mastoid air cells and middle ear cavities are clear. Small right occipital scalp soft tissue swelling. CT CERVICAL SPINE FINDINGS Alignment: Normal. Skull base and vertebrae: No acute fracture. No primary bone lesion or focal pathologic process.  Soft tissues and spinal canal: No prevertebral fluid or swelling. No visible canal hematoma. Disc levels: There is disc space narrowing and endplate remodeling at C5-C7, most severe at C5-6 in keeping with changes of moderate degenerative disc disease. Prevertebral soft tissues are not thickened on sagittal reformats. Broad-based disc bulges are noted throughout the cervical spine without significant canal stenosis. Uncovertebral arthrosis results in moderate to severe bilateral neuroforaminal narrowing at C5-C7. Upper chest: Emphysema Other: None IMPRESSION: 1. Small right occipital scalp soft tissue swelling. 2. No acute intracranial abnormality. No calvarial fracture. 3.  No acute fracture or malalignment of the cervical spine. 4. Moderate degenerative disc disease at C5-C7. Moderate to severe bilateral neuroforaminal narrowing at C5-C7. Electronically Signed   By: Worthy Heads M.D.   On: 09/02/2023 20:58    Procedures Procedures    Medications Ordered in ED Medications - No data to display  ED Course/ Medical Decision Making/ A&P Clinical Course as of 09/02/23 2110  Wed Sep 02, 2023  2103 No acute traumatic injury on CT imaging. Patient is stable for discharge back to his facility. [VK]    Clinical Course User Index [VK] Kingsley, Dencil Cayson K, DO                                 Medical Decision Making This patient presents to the ED with chief complaint(s) of fall with pertinent past medical history of dementia, COPD, hyperlipidemia which further complicates the presenting complaint. The complaint involves an extensive differential diagnosis and also carries with it a high risk of complications and morbidity.    The differential diagnosis includes due to patient's age and head trauma concern for ICH, mass effect, cervical spine fracture, he does have a small abrasion but no bony tenderness and full ROM of his right upper extremity making fracture dislocation unlikely, no other traumatic injury seen, no presyncopal symptoms a syncopal fall unlikely  Additional history obtained: Additional history obtained from EMS  Records reviewed Care Everywhere/External Records  ED Course and Reassessment: On patient's arrival he is hemodynamically stable in no acute distress.  Will need head CT and C-spine due to his age and head trauma.  He did not Clines any pain control at this time and will be closely reassessed.  Independent labs interpretation:  N/A  Independent visualization of imaging: - I independently visualized the following imaging with scope of interpretation limited to determining acute life threatening conditions related to emergency care:  CTH/C-spine, which revealed no acute traumatic injury  Consultation: - Consulted or discussed management/test interpretation w/ external professional: N/A  Consideration for admission or further workup: Patient has no emergent conditions requiring admission or further work-up at this time and is stable for discharge home with primary care follow-up  Social Determinants of health: N/A    Amount and/or Complexity of Data Reviewed Radiology: ordered.          Final Clinical Impression(s) / ED Diagnoses Final diagnoses:  Fall, initial encounter  Hematoma of scalp, initial encounter  Abrasion of right forearm, initial encounter    Rx / DC Orders ED Discharge Orders     None         Nolberto Batty, DO 09/02/23 2110

## 2023-09-02 NOTE — Discharge Instructions (Signed)
 You were seen in the emergency department after your fall.  Your imaging showed no broken bones or internal bleeding.  You do have a bump to the back of your head and you can ice this and you can take Tylenol  as needed.  You can follow-up with your primary doctor to have your symptoms rechecked.  You should return to the emergency department if you having severe headaches, repetitive vomiting, numbness or weakness on one side the body compared to the other or any other new or concerning symptoms.

## 2023-09-03 DIAGNOSIS — Z7401 Bed confinement status: Secondary | ICD-10-CM | POA: Diagnosis not present

## 2023-09-03 DIAGNOSIS — G629 Polyneuropathy, unspecified: Secondary | ICD-10-CM | POA: Diagnosis not present

## 2023-09-03 DIAGNOSIS — Z743 Need for continuous supervision: Secondary | ICD-10-CM | POA: Diagnosis not present

## 2023-09-03 DIAGNOSIS — R296 Repeated falls: Secondary | ICD-10-CM | POA: Diagnosis not present

## 2023-09-03 DIAGNOSIS — M5030 Other cervical disc degeneration, unspecified cervical region: Secondary | ICD-10-CM | POA: Diagnosis not present

## 2023-09-03 DIAGNOSIS — R404 Transient alteration of awareness: Secondary | ICD-10-CM | POA: Diagnosis not present

## 2023-09-03 DIAGNOSIS — F411 Generalized anxiety disorder: Secondary | ICD-10-CM | POA: Diagnosis not present

## 2023-09-03 DIAGNOSIS — W19XXXA Unspecified fall, initial encounter: Secondary | ICD-10-CM | POA: Diagnosis not present

## 2023-09-08 DIAGNOSIS — F411 Generalized anxiety disorder: Secondary | ICD-10-CM | POA: Diagnosis not present

## 2023-09-08 DIAGNOSIS — M5030 Other cervical disc degeneration, unspecified cervical region: Secondary | ICD-10-CM | POA: Diagnosis not present

## 2023-09-08 DIAGNOSIS — R471 Dysarthria and anarthria: Secondary | ICD-10-CM | POA: Diagnosis not present

## 2023-09-08 DIAGNOSIS — B0052 Herpesviral keratitis: Secondary | ICD-10-CM | POA: Diagnosis not present

## 2023-09-08 DIAGNOSIS — R262 Difficulty in walking, not elsewhere classified: Secondary | ICD-10-CM | POA: Diagnosis not present

## 2023-09-08 DIAGNOSIS — B0051 Herpesviral iridocyclitis: Secondary | ICD-10-CM | POA: Diagnosis not present

## 2023-09-08 DIAGNOSIS — G629 Polyneuropathy, unspecified: Secondary | ICD-10-CM | POA: Diagnosis not present

## 2023-09-08 DIAGNOSIS — G8111 Spastic hemiplegia affecting right dominant side: Secondary | ICD-10-CM | POA: Diagnosis not present

## 2023-09-08 DIAGNOSIS — H3020 Posterior cyclitis, unspecified eye: Secondary | ICD-10-CM | POA: Diagnosis not present

## 2023-09-08 DIAGNOSIS — S72115A Nondisplaced fracture of greater trochanter of left femur, initial encounter for closed fracture: Secondary | ICD-10-CM | POA: Diagnosis not present

## 2023-09-09 DIAGNOSIS — H0102A Squamous blepharitis right eye, upper and lower eyelids: Secondary | ICD-10-CM | POA: Diagnosis not present

## 2023-09-09 DIAGNOSIS — H04129 Dry eye syndrome of unspecified lacrimal gland: Secondary | ICD-10-CM | POA: Diagnosis not present

## 2023-09-09 DIAGNOSIS — H04123 Dry eye syndrome of bilateral lacrimal glands: Secondary | ICD-10-CM | POA: Diagnosis not present

## 2023-09-09 DIAGNOSIS — B0052 Herpesviral keratitis: Secondary | ICD-10-CM | POA: Diagnosis not present

## 2023-09-09 DIAGNOSIS — H0102B Squamous blepharitis left eye, upper and lower eyelids: Secondary | ICD-10-CM | POA: Diagnosis not present

## 2023-09-09 DIAGNOSIS — H179 Unspecified corneal scar and opacity: Secondary | ICD-10-CM | POA: Diagnosis not present

## 2023-09-11 DIAGNOSIS — B0052 Herpesviral keratitis: Secondary | ICD-10-CM | POA: Diagnosis not present

## 2023-09-11 DIAGNOSIS — H0102B Squamous blepharitis left eye, upper and lower eyelids: Secondary | ICD-10-CM | POA: Diagnosis not present

## 2023-09-11 DIAGNOSIS — F411 Generalized anxiety disorder: Secondary | ICD-10-CM | POA: Diagnosis not present

## 2023-09-11 DIAGNOSIS — G629 Polyneuropathy, unspecified: Secondary | ICD-10-CM | POA: Diagnosis not present

## 2023-09-11 DIAGNOSIS — H0102A Squamous blepharitis right eye, upper and lower eyelids: Secondary | ICD-10-CM | POA: Diagnosis not present

## 2023-09-12 ENCOUNTER — Encounter (HOSPITAL_COMMUNITY): Payer: Self-pay

## 2023-09-12 ENCOUNTER — Emergency Department (HOSPITAL_COMMUNITY)

## 2023-09-12 ENCOUNTER — Emergency Department (HOSPITAL_COMMUNITY)
Admission: EM | Admit: 2023-09-12 | Discharge: 2023-09-12 | Disposition: A | Discharge status: Skilled Nursing Facility | Attending: Emergency Medicine | Admitting: Emergency Medicine

## 2023-09-12 DIAGNOSIS — R531 Weakness: Secondary | ICD-10-CM | POA: Diagnosis not present

## 2023-09-12 DIAGNOSIS — S199XXA Unspecified injury of neck, initial encounter: Secondary | ICD-10-CM | POA: Diagnosis not present

## 2023-09-12 DIAGNOSIS — Z043 Encounter for examination and observation following other accident: Secondary | ICD-10-CM | POA: Diagnosis not present

## 2023-09-12 DIAGNOSIS — W19XXXA Unspecified fall, initial encounter: Secondary | ICD-10-CM | POA: Insufficient documentation

## 2023-09-12 DIAGNOSIS — I77819 Aortic ectasia, unspecified site: Secondary | ICD-10-CM | POA: Diagnosis not present

## 2023-09-12 DIAGNOSIS — M8580 Other specified disorders of bone density and structure, unspecified site: Secondary | ICD-10-CM | POA: Diagnosis not present

## 2023-09-12 DIAGNOSIS — R918 Other nonspecific abnormal finding of lung field: Secondary | ICD-10-CM | POA: Diagnosis not present

## 2023-09-12 DIAGNOSIS — Z743 Need for continuous supervision: Secondary | ICD-10-CM | POA: Diagnosis not present

## 2023-09-12 DIAGNOSIS — R9431 Abnormal electrocardiogram [ECG] [EKG]: Secondary | ICD-10-CM | POA: Diagnosis not present

## 2023-09-12 DIAGNOSIS — S0093XA Contusion of unspecified part of head, initial encounter: Secondary | ICD-10-CM | POA: Insufficient documentation

## 2023-09-12 DIAGNOSIS — R9082 White matter disease, unspecified: Secondary | ICD-10-CM | POA: Diagnosis not present

## 2023-09-12 DIAGNOSIS — R402411 Glasgow coma scale score 13-15, in the field [EMT or ambulance]: Secondary | ICD-10-CM | POA: Diagnosis not present

## 2023-09-12 DIAGNOSIS — S0990XA Unspecified injury of head, initial encounter: Secondary | ICD-10-CM | POA: Diagnosis not present

## 2023-09-12 DIAGNOSIS — Z7401 Bed confinement status: Secondary | ICD-10-CM | POA: Diagnosis not present

## 2023-09-12 DIAGNOSIS — S0003XA Contusion of scalp, initial encounter: Secondary | ICD-10-CM | POA: Diagnosis not present

## 2023-09-12 DIAGNOSIS — M51369 Other intervertebral disc degeneration, lumbar region without mention of lumbar back pain or lower extremity pain: Secondary | ICD-10-CM | POA: Diagnosis not present

## 2023-09-12 LAB — COMPREHENSIVE METABOLIC PANEL WITH GFR
ALT: 28 U/L (ref 0–44)
AST: 23 U/L (ref 15–41)
Albumin: 4.1 g/dL (ref 3.5–5.0)
Alkaline Phosphatase: 89 U/L (ref 38–126)
Anion gap: 10 (ref 5–15)
BUN: 17 mg/dL (ref 8–23)
CO2: 23 mmol/L (ref 22–32)
Calcium: 9.4 mg/dL (ref 8.9–10.3)
Chloride: 105 mmol/L (ref 98–111)
Creatinine, Ser: 1.01 mg/dL (ref 0.61–1.24)
GFR, Estimated: >60 mL/min (ref >60)
Glucose, Bld: 90 mg/dL (ref 70–99)
Potassium: 4 mmol/L (ref 3.5–5.1)
Sodium: 138 mmol/L (ref 135–145)
Total Bilirubin: 0.5 mg/dL (ref 0.0–1.2)
Total Protein: 7.3 g/dL (ref 6.5–8.1)

## 2023-09-12 LAB — CBC WITH DIFFERENTIAL/PLATELET
Abs Immature Granulocytes: 0.05 10*3/uL (ref 0.00–0.07)
Basophils Absolute: 0.1 10*3/uL (ref 0.0–0.1)
Basophils Relative: 1 %
Eosinophils Absolute: 0.2 10*3/uL (ref 0.0–0.5)
Eosinophils Relative: 3 %
HCT: 43.9 % (ref 39.0–52.0)
Hemoglobin: 14.6 g/dL (ref 13.0–17.0)
Immature Granulocytes: 1 %
Lymphocytes Relative: 22 %
Lymphs Abs: 2 10*3/uL (ref 0.7–4.0)
MCH: 31.4 pg (ref 26.0–34.0)
MCHC: 33.3 g/dL (ref 30.0–36.0)
MCV: 94.4 fL (ref 80.0–100.0)
Monocytes Absolute: 0.9 10*3/uL (ref 0.1–1.0)
Monocytes Relative: 10 %
Neutro Abs: 5.9 10*3/uL (ref 1.7–7.7)
Neutrophils Relative %: 63 %
Platelets: 221 10*3/uL (ref 150–400)
RBC: 4.65 MIL/uL (ref 4.22–5.81)
RDW: 14.1 % (ref 11.5–15.5)
WBC: 9.2 10*3/uL (ref 4.0–10.5)
nRBC: 0 % (ref 0.0–0.2)

## 2023-09-12 MED ORDER — SODIUM CHLORIDE 0.9 % IV BOLUS
500.0000 mL | Freq: Once | INTRAVENOUS | Status: AC
  Administered 2023-09-12: 500 mL via INTRAVENOUS

## 2023-09-12 MED ORDER — ACETAMINOPHEN 325 MG PO TABS
650.0000 mg | ORAL_TABLET | Freq: Once | ORAL | Status: AC
  Administered 2023-09-12: 650 mg via ORAL
  Filled 2023-09-12: qty 2

## 2023-09-12 NOTE — ED Notes (Signed)
 Report given to jean, rn

## 2023-09-12 NOTE — ED Provider Notes (Signed)
 [place] EMERGENCY DEPARTMENT AT [facility] Provider Note   CSN: [id number] Arrival date & time: 09/12/23  1453     History  No chief complaint on file.   Jonathan Burnett is a 73 y.o. male.  Pt is a 73 yo male with pmhx significant for hld, anemia, gerd, and spastic hemiplegia on the right.  Pt sustained a fall today.  He is not sure why he fell.  He "just fell."  He did hit the back of his head.  He is not on blood thinners.  He did not have a loc.  Pt ambulatory for EMS.         Home Medications Prior to Admission medications   Medication Sig Start Date End Date Taking? Authorizing Provider  HYDROcodone -acetaminophen  (NORCO/VICODIN) 5-325 MG tablet Take 1 tablet by mouth every 6 (six) hours as needed for severe pain. 01/17/23   Lyna Sandhoff, PA-C  prednisoLONE acetate (PRED FORTE) 1 % ophthalmic suspension SMARTSIG:1 Drop(s) In Eye(s) Daily PRN 04/10/21   [provider]      Allergies    Aspirin    Review of Systems   Review of Systems  Skin:  Positive for wound.  All other systems reviewed and are negative.   Physical Exam Updated Vital Signs BP (!) 137/98   Pulse (!) 101   Resp 16   SpO2 93%  Physical Exam Vitals and nursing note reviewed.  Constitutional:      Appearance: Normal appearance.  HENT:     Head: Normocephalic.     Comments: Contusion posterior head    Right Ear: External ear normal.     Left Ear: External ear normal.     Nose: Nose normal.     Mouth/Throat:     Mouth: Mucous membranes are moist.     Pharynx: Oropharynx is clear.  Eyes:     Extraocular Movements: Extraocular movements intact.     Conjunctiva/sclera: Conjunctivae normal.     Pupils: Pupils are equal, round, and reactive to light.  Cardiovascular:     Rate and Rhythm: Normal rate and regular rhythm.     Pulses: Normal pulses.     Heart sounds: Normal heart sounds.  Pulmonary:     Effort: Pulmonary effort is normal.     Breath sounds: Normal breath  sounds.  Abdominal:     General: Abdomen is flat. Bowel sounds are normal.     Palpations: Abdomen is soft.  Musculoskeletal:        General: Normal range of motion.     Cervical back: Normal range of motion and neck supple.     Comments: Contracture right upper extremity  Skin:    General: Skin is warm.     Capillary Refill: Capillary refill takes less than 2 seconds.  Neurological:     General: No focal deficit present.     Mental Status: He is alert and oriented to person, place, and time.  Psychiatric:        Mood and Affect: Mood normal.        Behavior: Behavior normal.     ED Results / Procedures / Treatments   Labs (all labs ordered are listed, but only abnormal results are displayed) Labs Reviewed  CBC WITH DIFFERENTIAL/PLATELET  COMPREHENSIVE METABOLIC PANEL WITH GFR  URINALYSIS, ROUTINE W REFLEX MICROSCOPIC    EKG EKG Interpretation Date/Time:  Saturday Sep 12 2023 15:54:32 EDT Ventricular Rate:  86 PR Interval:  151 QRS Duration:  81 QT Interval:  361 QTC Calculation: 432 R Axis:   41  Text Interpretation: Sinus rhythm Low voltage, extremity leads No significant change since last tracing Confirmed by Sueellen Emery [phone]) on 09/12/2023 4:04:49 PM  Radiology DG Pelvis 1-2 Views Result Date: 09/12/2023 CLINICAL DATA:  fall EXAM: PELVIS - 1-2 VIEW COMPARISON:  July 24, 2023 FINDINGS: Osteopenia. No evidence of pelvic fracture or diastasis.No dislocation.L4-L5 degenerative disc disease.Soft tissues are unremarkable. IMPRESSION: No acute fracture, pelvic bone diastasis, or dislocation. Electronically Signed   By: Rance Burrows M.D.   On: 09/12/2023 16:16   DG Chest 2 View Result Date: 09/12/2023 CLINICAL DATA:  Fall EXAM: CHEST - 2 VIEW COMPARISON:  Chest x-ray 01/13/2020 FINDINGS: Aorta is ectatic. The aortic arch is prominent in size measuring up to 4 cm, unchanged. Heart is normal in size. There is no lung consolidation, pleural effusion or pneumothorax.  There is some minimal strandy opacities in the lower lungs. No acute fractures are seen. IMPRESSION: 1. Minimal strandy opacities in the lower lungs, likely atelectasis. 2. Ectatic aorta. Electronically Signed   By: Tyron Gallon M.D.   On: 09/12/2023 16:16   CT Head Wo Contrast Result Date: 09/12/2023 CLINICAL DATA:  Fall from wheelchair hit head on table EXAM: CT HEAD WITHOUT CONTRAST CT CERVICAL SPINE WITHOUT CONTRAST TECHNIQUE: Multidetector CT imaging of the head and cervical spine was performed following the standard protocol without intravenous contrast. Multiplanar CT image reconstructions of the cervical spine were also generated. RADIATION DOSE REDUCTION: This exam was performed according to the departmental dose-optimization program which includes automated exposure control, adjustment of the mA and/or kV according to patient size and/or use of iterative reconstruction technique. COMPARISON:  09/02/2023 FINDINGS: CT HEAD FINDINGS Brain: No evidence of acute infarction, hemorrhage, hydrocephalus, extra-axial collection or mass lesion/mass effect. Mild periventricular white matter hypodensity. Incidental note of cavum septum pellucidum et vergae variant of the lateral ventricles. Vascular: No hyperdense vessel or unexpected calcification. Skull: Normal. Negative for fracture or focal lesion. Sinuses/Orbits: No acute finding. Other: None. CT CERVICAL SPINE FINDINGS Alignment: Normal. Skull base and vertebrae: No acute fracture. No primary bone lesion or focal pathologic process. Soft tissues and spinal canal: No prevertebral fluid or swelling. No visible canal hematoma. Disc levels: Focally moderate disc space height loss and osteophytosis of C5-C6 with otherwise relatively preserved disc spaces. Upper chest: Negative. Other: None. IMPRESSION: 1. No acute intracranial pathology. Small-vessel white matter disease. 2. No fracture or static subluxation of the cervical spine. 3. Focally moderate disc space  height loss and osteophytosis of C5-C6 with otherwise relatively preserved disc spaces. Electronically Signed   By: Fredricka Jenny M.D.   On: 09/12/2023 15:42   CT Cervical Spine Wo Contrast Result Date: 09/12/2023 CLINICAL DATA:  Fall from wheelchair hit head on table EXAM: CT HEAD WITHOUT CONTRAST CT CERVICAL SPINE WITHOUT CONTRAST TECHNIQUE: Multidetector CT imaging of the head and cervical spine was performed following the standard protocol without intravenous contrast. Multiplanar CT image reconstructions of the cervical spine were also generated. RADIATION DOSE REDUCTION: This exam was performed according to the departmental dose-optimization program which includes automated exposure control, adjustment of the mA and/or kV according to patient size and/or use of iterative reconstruction technique. COMPARISON:  09/02/2023 FINDINGS: CT HEAD FINDINGS Brain: No evidence of acute infarction, hemorrhage, hydrocephalus, extra-axial collection or mass lesion/mass effect. Mild periventricular white matter hypodensity. Incidental note of cavum septum pellucidum et vergae variant of the lateral ventricles. Vascular: No hyperdense vessel or unexpected calcification. Skull: Normal. Negative for  fracture or focal lesion. Sinuses/Orbits: No acute finding. Other: None. CT CERVICAL SPINE FINDINGS Alignment: Normal. Skull base and vertebrae: No acute fracture. No primary bone lesion or focal pathologic process. Soft tissues and spinal canal: No prevertebral fluid or swelling. No visible canal hematoma. Disc levels: Focally moderate disc space height loss and osteophytosis of C5-C6 with otherwise relatively preserved disc spaces. Upper chest: Negative. Other: None. IMPRESSION: 1. No acute intracranial pathology. Small-vessel white matter disease. 2. No fracture or static subluxation of the cervical spine. 3. Focally moderate disc space height loss and osteophytosis of C5-C6 with otherwise relatively preserved disc spaces.  Electronically Signed   By: Fredricka Jenny M.D.   On: 09/12/2023 15:42    Procedures Procedures    Medications Ordered in ED Medications  sodium chloride  0.9 % bolus 500 mL (0 mLs Intravenous Stopped 09/12/23 1721)  acetaminophen  (TYLENOL ) tablet 650 mg (650 mg Oral Given 09/12/23 1607)    ED Course/ Medical Decision Making/ A&P                                 Medical Decision Making Amount and/or Complexity of Data Reviewed Labs: ordered. Radiology: ordered.  Risk OTC drugs.   This patient presents to the ED for concern of fall, this involves an extensive number of treatment options, and is a complaint that carries with it a high risk of complications and morbidity.  The differential diagnosis includes multiple trauma, syncope, infection   Co morbidities that complicate the patient evaluation  hld, anemia, gerd, and spastic hemiplegia on the right   Additional history obtained:  Additional history obtained from epic chart review External records from outside source obtained and reviewed including EMS report   Lab Tests:  I Ordered, and personally interpreted labs.  The pertinent results include:  cbc nl, cmp nl   Imaging Studies ordered:  I ordered imaging studies including ct head/c-spine, pelvis, cxr  I independently visualized and interpreted imaging which showed  CT head/c-spine: IMPRESSION:  1. No acute intracranial pathology. Small-vessel white matter  disease.  2. No fracture or static subluxation of the cervical spine.  3. Focally moderate disc space height loss and osteophytosis of  C5-C6 with otherwise relatively preserved disc spaces.   CXR:  Minimal strandy opacities in the lower lungs, likely atelectasis.  2. Ectatic aorta.  Pelvis: No acute fracture, pelvic bone diastasis, or dislocation.  I agree with the radiologist interpretation   Medicines ordered and prescription drug management:  I ordered medication including tylenol /ivfs  for sx   Reevaluation of the patient after these medicines showed that the patient improved I have reviewed the patients home medicines and have made adjustments as needed   Test Considered:  ct   Critical Interventions:  ct   Problem List / ED Course:  Fall:  no internal injury.  He has a hx of frequent falls, so I think it is from ambulatory dysfunction.  Pt is able to ambulate.  He is stable for d/c.  Return if worse.    Reevaluation:  After the interventions noted above, I reevaluated the patient and found that they have :improved   Social Determinants of Health:  Lives at home   Dispostion:  After consideration of the diagnostic results and the patients response to treatment, I feel that the patent would benefit from discharge with outpatient f/u.          Final Clinical Impression(s) /  ED Diagnoses Final diagnoses:  Traumatic cephalohematoma, initial encounter    Rx / DC Orders ED Discharge Orders     None         Sueellen Emery, MD 09/12/23 1818

## 2023-09-12 NOTE — ED Notes (Signed)
 Pt was able to ambulate from Rm 2 to [place] [facility] with mim assistance. Due to needing supervision for safety he will remain in [place] [facility].

## 2023-09-12 NOTE — ED Notes (Signed)
 Not in room, taken to radiology

## 2023-09-12 NOTE — ED Notes (Signed)
 Ambulatory with steady gait with assist.

## 2023-09-12 NOTE — ED Triage Notes (Signed)
 VS per EMS:    120/80 92 14 95% RA 96.2 165 CBG

## 2023-09-12 NOTE — ED Triage Notes (Signed)
 Patient states he was transferring from wheelchair to bed by himself and states he fell. States he fell backwards and hit his head on the table, states it hurt when he hit it but denies pain. Rates 0/10. Patient is alert and oriented to person, and place. Confused to year but then states its 2025.  Patient does have Dementia but answers all questions by this nurse and PA appropriately. JRPRN

## 2023-09-12 NOTE — ED Triage Notes (Signed)
 Patient BIB EMS for fall at nursing facility. Fall during transfer from wheelchair per EMS. Patient hit his head and has laceration to top of head. Staff denied LOC. Patient has Dementia. Facility wanted patient checked out in ED. JRPRN

## 2023-09-12 NOTE — ED Notes (Signed)
 Not in room

## 2023-09-12 NOTE — ED Notes (Signed)
 Jean,rn took report

## 2023-09-12 NOTE — ED Notes (Addendum)
 No changes with presentation. Remains pleasantly confused. Pt pulled IV out after IVF bolus complete. Pt removed self from monitor. Site minimally swollen otherwise unremarkable. Alert, NAD, calm, interactive. No changes.

## 2023-10-10 ENCOUNTER — Emergency Department (HOSPITAL_COMMUNITY)
Admission: EM | Admit: 2023-10-10 | Discharge: 2023-10-10 | Disposition: A | Discharge status: Home / Self Care | Payer: Medicare (Managed Care) | Attending: Emergency Medicine | Admitting: Emergency Medicine

## 2023-10-10 ENCOUNTER — Emergency Department (HOSPITAL_COMMUNITY): Payer: Medicare (Managed Care)

## 2023-10-10 ENCOUNTER — Encounter (HOSPITAL_COMMUNITY): Payer: Self-pay

## 2023-10-10 ENCOUNTER — Other Ambulatory Visit: Payer: Self-pay

## 2023-10-10 DIAGNOSIS — S0990XA Unspecified injury of head, initial encounter: Secondary | ICD-10-CM | POA: Diagnosis present

## 2023-10-10 DIAGNOSIS — S0181XA Laceration without foreign body of other part of head, initial encounter: Secondary | ICD-10-CM | POA: Diagnosis not present

## 2023-10-10 DIAGNOSIS — W19XXXA Unspecified fall, initial encounter: Secondary | ICD-10-CM | POA: Diagnosis not present

## 2023-10-10 DIAGNOSIS — F0390 Unspecified dementia without behavioral disturbance: Secondary | ICD-10-CM | POA: Diagnosis not present

## 2023-10-10 NOTE — ED Notes (Signed)
 Dermabond at bedside.

## 2023-10-10 NOTE — ED Notes (Signed)
 [facility] contacted for transport back to [facility].

## 2023-10-10 NOTE — ED Notes (Signed)
 Report given to [place] at [facility].

## 2023-10-10 NOTE — ED Provider Notes (Signed)
 [place] EMERGENCY DEPARTMENT AT [facility] Provider Note   CSN: [id number] Arrival date & time: 10/10/23  1054     Patient presents with: Jonathan Burnett   Nashton Burnett is a 73 y.o. male.   Patient is a 73 year old male who presents after a fall.  He has a history of dementia and lives at [facility].  Per EMS, he rolled out of bed and hit his head.  There is a small laceration to his forehead.  He has not been complaining of any other injuries.  This happened this morning.  He is not on anticoagulants.  Chart reviewed, TDAP given in 2019       Prior to Admission medications   Medication Sig Start Date End Date Taking? Authorizing Provider  HYDROcodone -acetaminophen  (NORCO/VICODIN) 5-325 MG tablet Take 1 tablet by mouth every 6 (six) hours as needed for severe pain. 01/17/23   Geiple, Joshua, PA-C  prednisoLONE acetate (PRED FORTE) 1 % ophthalmic suspension SMARTSIG:1 Drop(s) In Eye(s) Daily PRN 04/10/21   [provider]    Allergies: Aspirin    Review of Systems  Unable to perform ROS: Dementia    Updated Vital Signs BP 117/80   Pulse 83   Temp 98.3 F (36.8 C) (Oral)   Resp 17   Ht 6' (1.829 m)   Wt 64.9 kg   SpO2 93%   BMI 19.39 kg/m   Physical Exam Constitutional:      Appearance: He is well-developed.  HENT:     Head: Normocephalic.     Comments: 1 cm linear laceration to the left forehead  Eyes:     Pupils: Pupils are equal, round, and reactive to light.   Neck:     Comments: No obvious pain to the cervical, thoracic or lumbosacral spine Cardiovascular:     Rate and Rhythm: Normal rate and regular rhythm.     Heart sounds: Normal heart sounds.  Pulmonary:     Effort: Pulmonary effort is normal. No respiratory distress.     Breath sounds: Normal breath sounds. No wheezing or rales.  Chest:     Chest wall: No tenderness.  Abdominal:     General: Bowel sounds are normal.     Palpations: Abdomen is soft.     Tenderness: There is  no abdominal tenderness. There is no guarding or rebound.   Musculoskeletal:        General: Normal range of motion.     Comments: No pain with palpation or range of motion of the extremities, including the hips  Lymphadenopathy:     Cervical: No cervical adenopathy.   Skin:    General: Skin is warm and dry.     Findings: No rash.   Neurological:     Mental Status: He is alert.     Comments: Oriented to person, moves all extremities symmetrically without focal deficits    (all labs ordered are listed, but only abnormal results are displayed) Labs Reviewed - No data to display  EKG: None  Radiology: CT Cervical Spine Wo Contrast Result Date: 10/10/2023 CLINICAL DATA:  73 year old male status post fall from bed striking head. Left forehead laceration. EXAM: CT CERVICAL SPINE WITHOUT CONTRAST TECHNIQUE: Multidetector CT imaging of the cervical spine was performed without intravenous contrast. Multiplanar CT image reconstructions were also generated. RADIATION DOSE REDUCTION: This exam was performed according to the departmental dose-optimization program which includes automated exposure control, adjustment of the mA and/or kV according to patient size and/or use of iterative  reconstruction technique. COMPARISON:  Head CT today.  Cervical spine CT 09/12/2023. FINDINGS: Alignment: Stable cervical lordosis. Cervicothoracic junction alignment is within normal limits. Bilateral posterior element alignment is within normal limits. Skull base and vertebrae: Stable bone mineralization. Visualized skull base is intact. No atlanto-occipital dissociation. C1 and C2 appear intact and aligned. No acute osseous abnormality identified. Soft tissues and spinal canal: No prevertebral fluid or swelling. No visible canal hematoma. Bulky right carotid bifurcation calcified atherosclerosis. Disc levels:  Stable cervical spine degeneration, maximal at C5-C6. Upper chest: Centrilobular emphysema in the upper lungs.  Grossly intact visible upper thoracic levels. IMPRESSION: 1. No acute traumatic injury identified in the cervical spine. Stable cervical spine degeneration. 2.  Emphysema (ICD10-J43.9). Electronically Signed   By: VEAR Hurst M.D.   On: 10/10/2023 12:17   CT Head Wo Contrast Result Date: 10/10/2023 CLINICAL DATA:  73 year old male status post fall from bed striking head. Left forehead laceration. EXAM: CT HEAD WITHOUT CONTRAST TECHNIQUE: Contiguous axial images were obtained from the base of the skull through the vertex without intravenous contrast. RADIATION DOSE REDUCTION: This exam was performed according to the departmental dose-optimization program which includes automated exposure control, adjustment of the mA and/or kV according to patient size and/or use of iterative reconstruction technique. COMPARISON:  Brain MRI 04/20/2023.  Head CT 09/12/2023. FINDINGS: Brain: Cerebral volume is stable and within normal limits for age. Cavum septum pellucidum, normal variant. No midline shift, ventriculomegaly, mass effect, evidence of mass lesion, intracranial hemorrhage or evidence of cortically based acute infarction. Gray-white differentiation stable and within normal limits for age. Vascular: No suspicious intracranial vascular hyperdensity. Skull: Stable and intact. Sinuses/Orbits: Bilateral paranasal sinus mucosal thickening not significantly changed. No layering sinus hemorrhage identified. Tympanic cavities and mastoids well aerated. Other: No acute orbit or scalp soft tissue injury identified. IMPRESSION: 1. No acute traumatic injury identified. Stable and negative for age noncontrast CT appearance of the brain. 2. Chronic paranasal sinus disease. Electronically Signed   By: VEAR Hurst M.D.   On: 10/10/2023 12:15     .Laceration Repair  Date/Time: 10/10/2023 1:16 PM  Performed by: Lenor Hollering, MD Authorized by: Lenor Hollering, MD   Consent:    Consent obtained:  Emergent situation Anesthesia:     Anesthesia method:  None Laceration details:    Location:  Face   Face location:  Forehead   Length (cm):  1 Pre-procedure details:    Preparation:  Patient was prepped and draped in usual sterile fashion and imaging obtained to evaluate for foreign bodies Exploration:    Hemostasis achieved with:  Direct pressure   Imaging obtained comment:  CT head   Imaging outcome: foreign body not noted     Wound extent: fascia not violated, no foreign body, no signs of injury, no underlying fracture and no vascular damage     Contaminated: no   Treatment:    Area cleansed with:  Saline   Amount of cleaning:  Standard   Irrigation solution:  Sterile saline   Irrigation method:  Syringe   Visualized foreign bodies/material removed: no   Skin repair:    Repair method:  Tissue adhesive Approximation:    Approximation:  Close Repair type:    Repair type:  Simple Post-procedure details:    Dressing:  Open (no dressing)   Procedure completion:  Tolerated well, no immediate complications    Medications Ordered in the ED - No data to display  Medical Decision Making Amount and/or Complexity of Data Reviewed Radiology: ordered.   This patient presents to the ED for concern of fall, this involves an extensive number of treatment options, and is a complaint that carries with it a high risk of complications and morbidity.  I considered the following differential and admission for this acute, potentially life threatening condition.  The differential diagnosis includes intracranial hemorrhage, concussion, laceration, fracture, skull fracture, infection, stroke  MDM:    Patient presents after mechanical fall.  He has a small laceration to his forehead that was repaired with Dermabond.  CT of his head and cervical spine do not show any acute traumatic injuries.  He does not have any other evidence of injuries on exam.  He was discharged back to the nursing facility.   His tetanus shot is up-to-date.  (Labs, imaging, consults)  Labs: I Ordered, and personally interpreted labs.  The pertinent results include:     Imaging Studies ordered: I ordered imaging studies including CT head, cervical spine I independently visualized and interpreted imaging. I agree with the radiologist interpretation  Additional history obtained from chart review.  External records from outside source obtained and reviewed including immunization records  Cardiac Monitoring: The patient was not maintained on a cardiac monitor.  If on the cardiac monitor, I personally viewed and interpreted the cardiac monitored which showed an underlying rhythm of:    Reevaluation: After the interventions noted above, I reevaluated the patient and found that they have :improved  Social Determinants of Health:  dementia, nursing home resident  Disposition: Discharged back to the nursing facility  Co morbidities that complicate the patient evaluation  Past Medical History:  Diagnosis Date   Anemia    Blood transfusion without reported diagnosis    with appendix rupture    Colon polyp    Diverticulosis    ED (erectile dysfunction)    GERD (gastroesophageal reflux disease)    diet controlled   Hyperlipidemia    Hypoglycemia    Wears dentures full   Wears glasses      Medicines No orders of the defined types were placed in this encounter.   I have reviewed the patients home medicines and have made adjustments as needed  Problem List / ED Course: Problem List Items Addressed This Visit   None Visit Diagnoses       Fall, initial encounter    -  Primary     Injury of head, initial encounter         Laceration of forehead, initial encounter                    Final diagnoses:  Fall, initial encounter  Injury of head, initial encounter  Laceration of forehead, initial encounter    ED Discharge Orders     None          Lenor Hollering, MD 10/10/23 1319

## 2023-10-10 NOTE — ED Triage Notes (Addendum)
 Pt BIB ambulance from [facility]. EMS reported he rolled out of bed and hit his head. He has small laceration to L forehead. Steri strips and bandage applied by staff. No pain reported. Not on blood thinners, no LOC. A&O x1. Hx dementia.

## 2024-01-07 ENCOUNTER — Encounter: Payer: Self-pay | Admitting: Gastroenterology

## 2024-05-15 DEATH — deceased
